# Patient Record
Sex: Male | Born: 1965
Health system: Southern US, Community
[De-identification: ages and names within clinical notes are randomized; demographics above are authoritative.]

## PROBLEM LIST (undated history)

## (undated) DIAGNOSIS — E785 Hyperlipidemia, unspecified: Secondary | ICD-10-CM

## (undated) DIAGNOSIS — Z8619 Personal history of other infectious and parasitic diseases: Secondary | ICD-10-CM

## (undated) HISTORY — DX: Hyperlipidemia, unspecified: E78.5

## (undated) HISTORY — DX: Personal history of other infectious and parasitic diseases: Z86.19

---

## 2003-11-03 ENCOUNTER — Ambulatory Visit: Payer: Self-pay | Admitting: Gastroenterology

## 2003-11-03 LAB — HM COLONOSCOPY

## 2006-03-09 ENCOUNTER — Emergency Department: Payer: Self-pay | Admitting: Emergency Medicine

## 2009-05-02 ENCOUNTER — Ambulatory Visit: Payer: Self-pay | Admitting: Family Medicine

## 2010-07-16 ENCOUNTER — Ambulatory Visit: Payer: Self-pay | Admitting: Family Medicine

## 2011-12-27 ENCOUNTER — Ambulatory Visit: Payer: Self-pay | Admitting: Family Medicine

## 2012-05-24 ENCOUNTER — Ambulatory Visit: Payer: Self-pay | Admitting: Family Medicine

## 2012-10-25 LAB — PSA: PSA: 0.3

## 2012-12-10 ENCOUNTER — Ambulatory Visit: Payer: Self-pay | Admitting: Physician Assistant

## 2013-05-25 ENCOUNTER — Ambulatory Visit: Payer: Self-pay | Admitting: Family Medicine

## 2013-05-26 ENCOUNTER — Ambulatory Visit: Payer: Self-pay

## 2013-10-14 ENCOUNTER — Ambulatory Visit: Payer: Self-pay | Admitting: Unknown Physician Specialty

## 2013-10-14 LAB — CBC AND DIFFERENTIAL
HEMATOCRIT: 48 % (ref 41–53)
Hemoglobin: 17 g/dL (ref 13.5–17.5)
PLATELETS: 279 10*3/uL (ref 150–399)
WBC: 6.3 10^3/mL

## 2013-10-20 DIAGNOSIS — R0989 Other specified symptoms and signs involving the circulatory and respiratory systems: Secondary | ICD-10-CM

## 2013-10-20 DIAGNOSIS — R0609 Other forms of dyspnea: Secondary | ICD-10-CM | POA: Insufficient documentation

## 2014-09-19 DIAGNOSIS — M549 Dorsalgia, unspecified: Secondary | ICD-10-CM | POA: Insufficient documentation

## 2014-09-19 DIAGNOSIS — M545 Low back pain, unspecified: Secondary | ICD-10-CM | POA: Insufficient documentation

## 2014-09-19 DIAGNOSIS — W57XXXA Bitten or stung by nonvenomous insect and other nonvenomous arthropods, initial encounter: Secondary | ICD-10-CM | POA: Insufficient documentation

## 2014-09-19 DIAGNOSIS — E785 Hyperlipidemia, unspecified: Secondary | ICD-10-CM | POA: Insufficient documentation

## 2014-09-19 DIAGNOSIS — J189 Pneumonia, unspecified organism: Secondary | ICD-10-CM | POA: Insufficient documentation

## 2014-09-19 DIAGNOSIS — K602 Anal fissure, unspecified: Secondary | ICD-10-CM | POA: Insufficient documentation

## 2014-09-20 ENCOUNTER — Encounter: Payer: Self-pay | Admitting: Family Medicine

## 2014-09-20 ENCOUNTER — Ambulatory Visit (INDEPENDENT_AMBULATORY_CARE_PROVIDER_SITE_OTHER): Payer: BLUE CROSS/BLUE SHIELD | Admitting: Family Medicine

## 2014-09-20 ENCOUNTER — Ambulatory Visit: Payer: Self-pay | Admitting: Family Medicine

## 2014-09-20 VITALS — BP 124/80 | HR 68 | Temp 98.5°F | Resp 16 | Ht 72.0 in | Wt 209.8 lb

## 2014-09-20 DIAGNOSIS — Z125 Encounter for screening for malignant neoplasm of prostate: Secondary | ICD-10-CM

## 2014-09-20 DIAGNOSIS — R110 Nausea: Secondary | ICD-10-CM | POA: Diagnosis not present

## 2014-09-20 DIAGNOSIS — K219 Gastro-esophageal reflux disease without esophagitis: Secondary | ICD-10-CM | POA: Diagnosis not present

## 2014-09-20 MED ORDER — SUCRALFATE 1 G PO TABS: 1.0000 g | ORAL_TABLET | Freq: Three times a day (TID) | ORAL | Status: DC

## 2014-09-20 MED ORDER — PANTOPRAZOLE SODIUM 20 MG PO TBEC: 20.0000 mg | DELAYED_RELEASE_TABLET | Freq: Every day | ORAL | Status: DC

## 2014-09-20 NOTE — Progress Notes (Signed)
Subjective:     Patient ID: Kyle Bishop, male   DOB: 1965/02/28, 49 y.o.   MRN: [id number]  HPI  Chief Complaint  Patient presents with  . Nausea    Patient comes in office today with concerns of nausea over the past 5 months. Patient states that he experiences this daily and over the past two months has got worse. Patient states " it feels like I am going to vomit in my sleep". Patient denies taking any otc medication and denies history of reflux.   States he also must spit out a syrupy phlegm in the AM but as the day progresses will spit out a foamy phlegm. Feels a "sticking" sensation in his throat which unaffected by swallowing. Denies allergies or heartburn. Has tried a course of omeprazole twice daily for two weeks with little improvement. No alcohol, significant caffeine use or smoking reported. Wishes to get his PSA checked today.   Review of Systems  Constitutional: Negative for fever, chills, appetite change and unexpected weight change.  Gastrointestinal: Negative for vomiting.       Objective:   Physical Exam  Constitutional: He appears well-developed and well-nourished. No distress.  Eyes: No scleral icterus.  Abdominal: Soft. Bowel sounds are normal. There is no tenderness. There is no guarding.  Ears: T.M's intact without inflammation Throat: no tonsillar enlargement or exudate Neck: no cervical adenopathy Lungs: clear     Assessment:    1. Nausea - Comprehensive metabolic panel - Lipase - CBC with Differential/Platelet  2. Screening for prostate cancer - PSA  3. Gastroesophageal reflux disease, esophagitis presence not specified - sucralfate (CARAFATE) 1 G tablet; Take 1 tablet (1 g total) by mouth 4 (four) times daily -  with meals and at bedtime.  Dispense: 28 tablet; Refill: 0 - pantoprazole (PROTONIX) 20 MG tablet; Take 1 tablet (20 mg total) by mouth daily.  Dispense: 30 tablet; Refill: 0    Plan:   Further f/u pending lab work and response to  medication.

## 2014-09-20 NOTE — Patient Instructions (Signed)
We will call you with lab results. Consider head of bed elevation on 6 inch blocks.

## 2014-09-21 ENCOUNTER — Telehealth: Payer: Self-pay

## 2014-09-21 LAB — CBC WITH DIFFERENTIAL/PLATELET
BASOS ABS: 0 10*3/uL (ref 0.0–0.2)
Basos: 0 %
EOS (ABSOLUTE): 0.2 10*3/uL (ref 0.0–0.4)
Eos: 4 %
HEMOGLOBIN: 15.1 g/dL (ref 12.6–17.7)
Hematocrit: 44 % (ref 37.5–51.0)
Immature Grans (Abs): 0 10*3/uL (ref 0.0–0.1)
Immature Granulocytes: 0 %
LYMPHS ABS: 1.6 10*3/uL (ref 0.7–3.1)
Lymphs: 33 %
MCH: 32.1 pg (ref 26.6–33.0)
MCHC: 34.3 g/dL (ref 31.5–35.7)
MCV: 94 fL (ref 79–97)
MONOCYTES: 9 %
Monocytes Absolute: 0.4 10*3/uL (ref 0.1–0.9)
Neutrophils Absolute: 2.5 10*3/uL (ref 1.4–7.0)
Neutrophils: 54 %
PLATELETS: 244 10*3/uL (ref 150–379)
RBC: 4.7 x10E6/uL (ref 4.14–5.80)
RDW: 12.9 % (ref 12.3–15.4)
WBC: 4.7 10*3/uL (ref 3.4–10.8)

## 2014-09-21 LAB — COMPREHENSIVE METABOLIC PANEL
ALK PHOS: 72 IU/L (ref 39–117)
ALT: 34 IU/L (ref 0–44)
AST: 24 IU/L (ref 0–40)
Albumin/Globulin Ratio: 2 (ref 1.1–2.5)
Albumin: 4.2 g/dL (ref 3.5–5.5)
BUN/Creatinine Ratio: 14 (ref 9–20)
BUN: 13 mg/dL (ref 6–24)
Bilirubin Total: 0.9 mg/dL (ref 0.0–1.2)
CHLORIDE: 101 mmol/L (ref 97–108)
CO2: 25 mmol/L (ref 18–29)
CREATININE: 0.92 mg/dL (ref 0.76–1.27)
Calcium: 9.4 mg/dL (ref 8.7–10.2)
GFR calc Af Amer: 112 mL/min/{1.73_m2} (ref >59)
GFR calc non Af Amer: 97 mL/min/{1.73_m2} (ref >59)
GLOBULIN, TOTAL: 2.1 g/dL (ref 1.5–4.5)
GLUCOSE: 91 mg/dL (ref 65–99)
Potassium: 4.2 mmol/L (ref 3.5–5.2)
SODIUM: 139 mmol/L (ref 134–144)
Total Protein: 6.3 g/dL (ref 6.0–8.5)

## 2014-09-21 LAB — LIPASE: LIPASE: 56 U/L (ref 0–59)

## 2014-09-21 LAB — PSA: PROSTATE SPECIFIC AG, SERUM: 0.4 ng/mL (ref 0.0–4.0)

## 2014-09-21 NOTE — Telephone Encounter (Signed)
[id number] 

## 2014-09-21 NOTE — Telephone Encounter (Signed)
-----   Message from Carmon Ginsberg, [place] sent at 09/21/2014  7:43 AM EDT ----- Your labs look great. Let 's see you in two weeks and see how you are doing on the new medication.

## 2014-09-26 NOTE — Telephone Encounter (Signed)
Patient has been advised of PSA he will arrange appt next week with Mikki Santee.KW

## 2014-10-05 ENCOUNTER — Encounter: Payer: Self-pay | Admitting: Family Medicine

## 2014-10-05 ENCOUNTER — Ambulatory Visit (INDEPENDENT_AMBULATORY_CARE_PROVIDER_SITE_OTHER): Payer: BLUE CROSS/BLUE SHIELD | Admitting: Family Medicine

## 2014-10-05 VITALS — BP 110/78 | HR 84 | Temp 98.1°F | Resp 16 | Wt 211.8 lb

## 2014-10-05 DIAGNOSIS — K219 Gastro-esophageal reflux disease without esophagitis: Secondary | ICD-10-CM | POA: Diagnosis not present

## 2014-10-05 MED ORDER — PANTOPRAZOLE SODIUM 20 MG PO TBEC: DELAYED_RELEASE_TABLET | ORAL | Status: DC

## 2014-10-05 NOTE — Progress Notes (Signed)
Subjective:     Patient ID: Kyle Bishop, male   DOB: Sep 10, 1965, 49 y.o.   MRN: [id number]  HPI  Chief Complaint  Patient presents with  . Follow-up    Patient is here for follow u from 8/31/, patient was dianosed weith GERD and prescribed Sucralfate and Protonix. Patient reports that symptoms are improving  Reports reflux has nearly resolved and nausea is gone. Reports he is not spitting as much in the AM. Did not use the Carafate.   Review of Systems     Objective:   Physical Exam  Constitutional: He appears well-developed and well-nourished. No distress.       Assessment:    1. Gastroesophageal reflux disease, esophagitis presence not specified: improved  - pantoprazole (PROTONIX) 20 MG tablet; Daily as needed for acid reflux.  Dispense: 30 tablet; Refill: 2    Plan:    Discussed completing 4 week course of pantoprazole then using prn. May try Carafate at night.

## 2014-10-05 NOTE — Patient Instructions (Signed)
Complete course of the acid blocker then stop if better. Try Carafate at bedtime. May use acid blocker as needed for flareups.

## 2015-03-07 ENCOUNTER — Ambulatory Visit (INDEPENDENT_AMBULATORY_CARE_PROVIDER_SITE_OTHER): Payer: BLUE CROSS/BLUE SHIELD | Admitting: Family Medicine

## 2015-03-07 ENCOUNTER — Telehealth: Payer: Self-pay

## 2015-03-07 ENCOUNTER — Encounter: Payer: Self-pay | Admitting: Family Medicine

## 2015-03-07 VITALS — BP 120/82 | HR 82 | Temp 98.0°F | Resp 16 | Ht 72.0 in | Wt 219.0 lb

## 2015-03-07 DIAGNOSIS — R51 Headache: Secondary | ICD-10-CM

## 2015-03-07 DIAGNOSIS — H539 Unspecified visual disturbance: Secondary | ICD-10-CM

## 2015-03-07 DIAGNOSIS — R519 Headache, unspecified: Secondary | ICD-10-CM

## 2015-03-07 MED ORDER — BUTALBITAL-APAP-CAFFEINE 50-325-40 MG PO TABS: 1.0000 | ORAL_TABLET | Freq: Four times a day (QID) | ORAL | Status: AC | PRN

## 2015-03-07 NOTE — Progress Notes (Signed)
Patient: Kyle Bishop Male    DOB: 02/12/1965   50 y.o.   MRN: [id number] Visit Date: 03/07/2015  Today's Provider: Lelon Huh, MD   Chief Complaint  Patient presents with  . Headache   Subjective:    Headache  This is a new problem. The current episode started yesterday. The problem has been unchanged. Pain location: in the back of his head and on the front left side. The pain quality is similar to prior headaches (had a siomilar headache 2 weeks ago and it last 1 day). The quality of the pain is described as aching and dull. Associated symptoms include insomnia, neck pain and a visual change. Pertinent negatives include no abdominal pain, anorexia, back pain, coughing, dizziness, drainage, ear pain, eye pain, eye redness, eye watering, facial sweating, fever, hearing loss, loss of balance, muscle aches, nausea, numbness, phonophobia, rhinorrhea, scalp tenderness, seizures, sinus pressure, sore throat, swollen glands, tingling, tinnitus, vomiting, weakness or weight loss. Blurred vision: blurred vision yesterday lasted 1 hour. He has tried acetaminophen for the symptoms. The treatment provided no relief.   He states headache started yesterday about 6pm. For about an hour started prior to headache he noticed speckles throughout his vision, seeming to affect both eyes, and all visual fields. Developed headache this morning and noticed that peripheral vision was blurry. Has had no change in diet, no new medications or supplements. No cough or cole symptoms. The headaches start in the back of his head, can be on either side. Today headache has migrated behind his left eye. Marland Kitchen     No Known Allergies Previous Medications   PANTOPRAZOLE (PROTONIX) 20 MG TABLET    Daily as needed for acid reflux.   SUCRALFATE (CARAFATE) 1 G TABLET    Take 1 tablet (1 g total) by mouth 4 (four) times daily -  with meals and at bedtime.    Review of Systems  Constitutional: Negative for fever, chills,  weight loss and appetite change.  HENT: Negative for ear pain, hearing loss, rhinorrhea, sinus pressure, sore throat and tinnitus.   Eyes: Negative for pain and redness. Blurred vision: blurred vision yesterday lasted 1 hour.  Respiratory: Negative for cough, chest tightness, shortness of breath and wheezing.   Cardiovascular: Negative for chest pain and palpitations.  Gastrointestinal: Negative for nausea, vomiting, abdominal pain and anorexia.  Musculoskeletal: Positive for neck pain. Negative for back pain.  Neurological: Positive for headaches. Negative for dizziness, tingling, seizures, weakness, numbness and loss of balance.  Psychiatric/Behavioral: The patient has insomnia.     Social History  Substance Use Topics  . Smoking status: Never Smoker   . Smokeless tobacco: Not on file  . Alcohol Use: No   Objective:   BP 120/82 mmHg  Pulse 82  Temp(Src) 98 F (36.7 C) (Oral)  Resp 16  Ht 6' (1.829 m)  Wt 219 lb (99.338 kg)  BMI 29.70 kg/m2  SpO2 97%  Physical Exam  General Appearance:    Alert, cooperative, no distress  HENT:   ENT exam normal, no neck nodes or sinus tenderness  Eyes:    PERRL, conjunctiva/corneas clear, EOM's intact       Lungs:     Clear to auscultation bilaterally, respirations unlabored  Heart:    Regular rate and rhythm  Neurologic:   Awake, alert, oriented x 3. No apparent focal neurological           defect.  Assessment & Plan:     1. Visual changes  - Comprehensive metabolic panel - CBC - Ambulatory referral to Ophthalmology  2. Acute nonintractable headache, unspecified headache type Long differential . Rule out ocular disease as above. Possibly new onset migraines. Consider imaging studies if ophthalmology evaluation is unremarkable.  - Comprehensive metabolic panel - CBC - butalbital-acetaminophen-caffeine (FIORICET) 50-325-40 MG tablet; Take 1-2 tablets by mouth every 6 (six) hours as needed for headache.  Dispense: 20  tablet; Refill: 0       Lelon Huh, MD  [facility]

## 2015-03-07 NOTE — Telephone Encounter (Signed)
Patient called stating yesterday he developed burred vision that lasted 1 hr. Right after the blurred vision, he started having a dull headache in the back of his head. Patient states the headache has been constant is not easing up. Patient denies any numbness, shortness of breath, chest pain, lightheadedness or dizziness. Patient states he has been off his cholesterol medication for 6 months. He restarted taking the cholesterol medication 1 week ago. I did not see any medication in his chart that is prescribed for cholesterol and patient does not remember the name of it. Patient has scheduled an appointment to come in today at 2:45pm.

## 2015-03-08 LAB — CBC
HEMATOCRIT: 40.8 % (ref 37.5–51.0)
Hemoglobin: 14.8 g/dL (ref 12.6–17.7)
MCH: 33.7 pg — AB (ref 26.6–33.0)
MCHC: 36.3 g/dL — AB (ref 31.5–35.7)
MCV: 93 fL (ref 79–97)
PLATELETS: 255 10*3/uL (ref 150–379)
RBC: 4.39 x10E6/uL (ref 4.14–5.80)
RDW: 12.1 % — AB (ref 12.3–15.4)
WBC: 5.4 10*3/uL (ref 3.4–10.8)

## 2015-03-08 LAB — COMPREHENSIVE METABOLIC PANEL
ALK PHOS: 76 IU/L (ref 39–117)
ALT: 27 IU/L (ref 0–44)
AST: 21 IU/L (ref 0–40)
Albumin/Globulin Ratio: 2 (ref 1.1–2.5)
Albumin: 4.3 g/dL (ref 3.5–5.5)
BILIRUBIN TOTAL: 0.3 mg/dL (ref 0.0–1.2)
BUN / CREAT RATIO: 16 (ref 9–20)
BUN: 15 mg/dL (ref 6–24)
CO2: 20 mmol/L (ref 18–29)
CREATININE: 0.94 mg/dL (ref 0.76–1.27)
Calcium: 9.4 mg/dL (ref 8.7–10.2)
Chloride: 103 mmol/L (ref 96–106)
GFR calc non Af Amer: 94 mL/min/{1.73_m2} (ref >59)
GFR, EST AFRICAN AMERICAN: 109 mL/min/{1.73_m2} (ref >59)
GLOBULIN, TOTAL: 2.1 g/dL (ref 1.5–4.5)
GLUCOSE: 102 mg/dL — AB (ref 65–99)
Potassium: 3.9 mmol/L (ref 3.5–5.2)
SODIUM: 141 mmol/L (ref 134–144)
Total Protein: 6.4 g/dL (ref 6.0–8.5)

## 2015-03-22 ENCOUNTER — Telehealth: Payer: Self-pay

## 2015-03-22 DIAGNOSIS — H539 Unspecified visual disturbance: Secondary | ICD-10-CM

## 2015-03-22 DIAGNOSIS — R51 Headache: Principal | ICD-10-CM

## 2015-03-22 DIAGNOSIS — R519 Headache, unspecified: Secondary | ICD-10-CM

## 2015-03-22 NOTE — Telephone Encounter (Signed)
Patient came up to the office wanting to know what the next step was for treatment of his headaches and blurred vision. Patient was referred to Dr. Jeni Salles and seen on 03/09/2015. Per patient reports Dr. Jeni Salles says there is nothing he could do for him. Patient states Dr. Jeni Salles called and spoke with Dr. Caryn Section about that visit. Patient is still having the same symptoms and wants to know what is the next step of treatment. I didn't see a consult note from Dr. Vickii Penna office. I called over to [facility] and requested records. Patient call back: [phone]

## 2015-03-23 DIAGNOSIS — H539 Unspecified visual disturbance: Secondary | ICD-10-CM | POA: Insufficient documentation

## 2015-03-23 DIAGNOSIS — R51 Headache: Secondary | ICD-10-CM

## 2015-03-23 DIAGNOSIS — R519 Headache, unspecified: Secondary | ICD-10-CM | POA: Insufficient documentation

## 2015-03-23 NOTE — Telephone Encounter (Signed)
Need to proceed with MRI of brain. Need to schedule o.v. 1-2 days after MRI. Please forward to sarah to schedule MRI of brain with contrast.

## 2015-03-23 NOTE — Telephone Encounter (Signed)
[id number] 

## 2015-03-26 NOTE — Telephone Encounter (Signed)
Patient advised and verbally voiced understanding. MRI scheduled for 04/09/2015. Patient states he will call back to schedule follow up.

## 2015-04-09 ENCOUNTER — Ambulatory Visit
Admission: RE | Admit: 2015-04-09 | Discharge: 2015-04-09 | Disposition: A | Discharge status: Home / Self Care | Payer: BLUE CROSS/BLUE SHIELD | Source: Ambulatory Visit | Attending: Family Medicine | Admitting: Family Medicine

## 2015-04-09 DIAGNOSIS — J328 Other chronic sinusitis: Secondary | ICD-10-CM | POA: Insufficient documentation

## 2015-04-09 DIAGNOSIS — H539 Unspecified visual disturbance: Secondary | ICD-10-CM | POA: Diagnosis present

## 2015-04-09 DIAGNOSIS — R519 Headache, unspecified: Secondary | ICD-10-CM

## 2015-04-09 DIAGNOSIS — R51 Headache: Secondary | ICD-10-CM | POA: Diagnosis present

## 2015-04-09 MED ORDER — GADOBENATE DIMEGLUMINE 529 MG/ML IV SOLN
20.0000 mL | Freq: Once | INTRAVENOUS | Status: AC | PRN
  Administered 2015-04-09: 20 mL via INTRAVENOUS

## 2015-06-06 ENCOUNTER — Encounter: Payer: Self-pay | Admitting: Family Medicine

## 2015-06-06 ENCOUNTER — Ambulatory Visit (INDEPENDENT_AMBULATORY_CARE_PROVIDER_SITE_OTHER): Payer: BLUE CROSS/BLUE SHIELD | Admitting: Family Medicine

## 2015-06-06 VITALS — BP 110/80 | HR 82 | Temp 97.6°F | Resp 16 | Ht 72.0 in | Wt 210.0 lb

## 2015-06-06 DIAGNOSIS — J069 Acute upper respiratory infection, unspecified: Secondary | ICD-10-CM | POA: Diagnosis not present

## 2015-06-06 DIAGNOSIS — E785 Hyperlipidemia, unspecified: Secondary | ICD-10-CM

## 2015-06-06 MED ORDER — AMOXICILLIN 500 MG PO CAPS: 1000.0000 mg | ORAL_CAPSULE | Freq: Two times a day (BID) | ORAL | Status: AC

## 2015-06-06 MED ORDER — LOVASTATIN 20 MG PO TABS: 20.0000 mg | ORAL_TABLET | Freq: Every day | ORAL | Status: DC

## 2015-06-06 NOTE — Progress Notes (Signed)
Patient: Kyle Bishop Male    DOB: 12/23/1965   50 y.o.   MRN: [id number] Visit Date: 06/06/2015  Today's Provider: Lelon Huh, MD   Chief Complaint  Patient presents with  . Cough   Subjective:    Cough This is a new problem. The current episode started in the past 7 days (4 days ago). The problem has been gradually improving. The problem occurs constantly. The cough is productive of sputum. Associated symptoms include ear congestion, headaches, myalgias, nasal congestion, postnasal drip and shortness of breath. Pertinent negatives include no chest pain, chills, ear pain, fever, heartburn, hemoptysis, rash, rhinorrhea, sore throat, sweats, weight loss or wheezing. The symptoms are aggravated by cold air. Treatments tried: sudafed. The treatment provided moderate relief. His past medical history is significant for pneumonia. There is no history of asthma, bronchiectasis, bronchitis, COPD, emphysema or environmental allergies.    Cough started 4 days ago. Symptoms of sinus congestion, ear congestion, productive cough, and headaches. Also has some sob.Has been taking sudafed and feels much better today.   He also states he has been off of lovastatin for several months, but once to get new prescription sent to his mail order so he can restart medication.    No Known Allergies Previous Medications   BUTALBITAL-ACETAMINOPHEN-CAFFEINE (FIORICET) 50-325-40 MG TABLET    Take 1-2 tablets by mouth every 6 (six) hours as needed for headache.   PANTOPRAZOLE (PROTONIX) 20 MG TABLET    Daily as needed for acid reflux.    Review of Systems  Constitutional: Negative for fever, chills, weight loss and appetite change.  HENT: Positive for congestion and postnasal drip. Negative for ear pain, rhinorrhea and sore throat.   Respiratory: Positive for cough and shortness of breath. Negative for hemoptysis, chest tightness and wheezing.   Cardiovascular: Negative for chest pain and  palpitations.  Gastrointestinal: Negative for heartburn, nausea, vomiting and abdominal pain.  Musculoskeletal: Positive for myalgias.  Skin: Negative for rash.  Allergic/Immunologic: Negative for environmental allergies.  Neurological: Positive for headaches.  cough   Social History  Substance Use Topics  . Smoking status: Never Smoker   . Smokeless tobacco: Not on file  . Alcohol Use: No   Objective:   BP 110/80 mmHg  Pulse 82  Temp(Src) 97.6 F (36.4 C) (Oral)  Resp 16  Ht 6' (1.829 m)  Wt 210 lb (95.255 kg)  BMI 28.47 kg/m2  SpO2 97%  Physical Exam  General Appearance:    Alert, cooperative, no distress  HENT:   bilateral TM normal without fluid or infection, neck without nodes, sinuses nontender, post nasal drip noted and nasal mucosa pale and congested  Eyes:    PERRL, conjunctiva/corneas clear, EOM's intact       Lungs:     Clear to auscultation bilaterally, respirations unlabored  Heart:    Regular rate and rhythm  Neurologic:   Awake, alert, oriented x 3. No apparent focal neurological           defect.           Assessment & Plan:     1. Upper respiratory infection Starting to improve with symptomatic treatment. Sent with prescription for amoxicillin to fill only if symptoms worsen or do not continue to improve.  - amoxicillin (AMOXIL) 500 MG capsule; Take 2 capsules (1,000 mg total) by mouth 2 (two) times daily.  Dispense: 28 capsule; Refill: 0  2. Hyperlipidemia Restart lovastatin and follow up for labs and  CPE in a couple of months.  - lovastatin (MEVACOR) 20 MG tablet; Take 1 tablet (20 mg total) by mouth at bedtime.  Dispense: 90 tablet; Refill: 2        Lelon Huh, MD  [facility]

## 2015-06-11 ENCOUNTER — Telehealth: Payer: Self-pay | Admitting: Family Medicine

## 2015-06-11 MED ORDER — AZITHROMYCIN 250 MG PO TABS: ORAL_TABLET | ORAL | Status: AC

## 2015-06-11 NOTE — Telephone Encounter (Signed)
Pt states he was in last week for cough and congestion.  Pt states he is not any better.  Pt is requesting a zpack. [facility].  [id number]

## 2015-08-27 ENCOUNTER — Ambulatory Visit (INDEPENDENT_AMBULATORY_CARE_PROVIDER_SITE_OTHER): Payer: BLUE CROSS/BLUE SHIELD | Admitting: Family Medicine

## 2015-08-27 ENCOUNTER — Encounter: Payer: Self-pay | Admitting: Family Medicine

## 2015-08-27 VITALS — BP 116/78 | HR 88 | Temp 97.7°F | Resp 16 | Wt 207.0 lb

## 2015-08-27 DIAGNOSIS — J329 Chronic sinusitis, unspecified: Secondary | ICD-10-CM

## 2015-08-27 MED ORDER — AZITHROMYCIN 250 MG PO TABS: ORAL_TABLET | ORAL | 0 refills | Status: AC

## 2015-08-27 NOTE — Progress Notes (Signed)
        Patient: Kyle Bishop Male    DOB: Sep 18, 1965   50 y.o.   MRN: [id number] Visit Date: 08/27/2015  Today's Provider: Lelon Huh, MD   Chief Complaint  Patient presents with  . Hoarse  . Sinusitis   Subjective:    Sinusitis  This is a chronic problem. The current episode started more than 1 month ago. The problem is unchanged. There has been no fever. Associated symptoms include congestion, ear pain (Pt reports having ear prassure all the time. ), shortness of breath, sinus pressure and a sore throat. Pertinent negatives include no coughing, headaches or sneezing.   He had similar symptoms in May and states symptoms did not resolve until he got on a Zpack.      No Known Allergies Current Meds  Medication Sig  . lovastatin (MEVACOR) 20 MG tablet Take 1 tablet (20 mg total) by mouth at bedtime.    Review of Systems  Constitutional: Negative.   HENT: Positive for congestion, ear pain (Pt reports having ear prassure all the time. ), nosebleeds, sinus pressure, sore throat and voice change. Negative for ear discharge, postnasal drip, rhinorrhea, sneezing, tinnitus and trouble swallowing.   Eyes: Negative.   Respiratory: Positive for apnea and shortness of breath. Negative for cough, choking, chest tightness, wheezing and stridor.   Cardiovascular: Negative.   Gastrointestinal: Negative.   Allergic/Immunologic: Negative for environmental allergies and food allergies.  Neurological: Negative for dizziness, light-headedness and headaches.    Social History  Substance Use Topics  . Smoking status: Never Smoker  . Smokeless tobacco: Not on file  . Alcohol use No   Objective:   BP 116/78 (BP Location: Left Arm, Patient Position: Sitting, Cuff Size: Normal)   Pulse 88   Temp 97.7 F (36.5 C) (Oral)   Resp 16   Wt 207 lb (93.9 kg)   BMI 28.07 kg/m   Physical Exam  General Appearance:    Alert, cooperative, no distress  HENT:   bilateral TM normal without  fluid or infection, neck without nodes, throat normal without erythema or exudate, post nasal drip noted and nasal mucosa pale and congested  Eyes:    PERRL, conjunctiva/corneas clear, EOM's intact       Lungs:     Clear to auscultation bilaterally, respirations unlabored  Heart:    Regular rate and rhythm  Neurologic:   Awake, alert, oriented x 3. No apparent focal neurological           defect.           Assessment & Plan:     1. Sinusitis, unspecified chronicity, unspecified location Likely has some underlying allergies. Consider singulair and antihistamine.  - azithromycin (ZITHROMAX) 250 MG tablet; 2 by mouth today, then 1 daily for 4 days  Dispense: 6 tablet; Refill: 0     .The entirety of the information documented in the History of Present Illness, Review of Systems and Physical Exam were personally obtained by me. Portions of this information were initially documented by Ashley Royalty, CMA and reviewed by me for thoroughness and accuracy.    Lelon Huh, MD  [facility]

## 2015-09-07 ENCOUNTER — Encounter: Payer: BLUE CROSS/BLUE SHIELD | Admitting: Family Medicine

## 2016-01-07 ENCOUNTER — Ambulatory Visit (INDEPENDENT_AMBULATORY_CARE_PROVIDER_SITE_OTHER): Payer: BLUE CROSS/BLUE SHIELD | Admitting: Family Medicine

## 2016-01-07 ENCOUNTER — Encounter: Payer: Self-pay | Admitting: Family Medicine

## 2016-01-07 VITALS — BP 118/80 | HR 108 | Temp 100.2°F | Resp 16 | Wt 214.0 lb

## 2016-01-07 DIAGNOSIS — J329 Chronic sinusitis, unspecified: Secondary | ICD-10-CM | POA: Diagnosis not present

## 2016-01-07 LAB — POCT INFLUENZA A/B
INFLUENZA A, POC: NEGATIVE
INFLUENZA B, POC: NEGATIVE

## 2016-01-07 MED ORDER — AMOXICILLIN 500 MG PO CAPS: 1000.0000 mg | ORAL_CAPSULE | Freq: Two times a day (BID) | ORAL | 0 refills | Status: AC

## 2016-01-07 NOTE — Progress Notes (Signed)
       Patient: Kyle Bishop Male    DOB: 1966-01-17   50 y.o.   MRN: [id number] Visit Date: 01/07/2016  Today's Provider: Lelon Huh, MD   Chief Complaint  Patient presents with  . URI   Subjective:    HPI Patient comes in today c/o body aches, fever, headache, cough and congestion. He reports that he has had symptoms X 2 days. Patient reports that he has only been taking Mucinex for his symptoms. Patient reports that he has not had a flu vaccine this year.     No Known Allergies   Current Outpatient Prescriptions:  .  butalbital-acetaminophen-caffeine (FIORICET) 50-325-40 MG tablet, Take 1-2 tablets by mouth every 6 (six) hours as needed for headache. (Patient not taking: Reported on 01/07/2016), Disp: 20 tablet, Rfl: 0 .  lovastatin (MEVACOR) 20 MG tablet, Take 1 tablet (20 mg total) by mouth at bedtime. (Patient not taking: Reported on 01/07/2016), Disp: 90 tablet, Rfl: 2 .  pantoprazole (PROTONIX) 20 MG tablet, Daily as needed for acid reflux. (Patient not taking: Reported on 01/07/2016), Disp: 30 tablet, Rfl: 2  Review of Systems  Constitutional: Positive for activity change, appetite change, chills, fatigue and fever.  HENT: Positive for congestion, postnasal drip, rhinorrhea and sinus pressure.   Respiratory: Positive for cough.   Cardiovascular: Negative.   Musculoskeletal: Positive for neck pain.  Neurological: Positive for headaches.    Social History  Substance Use Topics  . Smoking status: Never Smoker  . Smokeless tobacco: Not on file  . Alcohol use No   Objective:   BP 118/80 (BP Location: Right Arm, Patient Position: Sitting, Cuff Size: Normal)   Pulse (!) 108   Temp 100.2 F (37.9 C)   Resp 16   Wt 214 lb (97.1 kg)   SpO2 98%   BMI 29.02 kg/m   Physical Exam  General Appearance:    Alert, cooperative, no distress  HENT:   bilateral TM normal without fluid or infection, neck without nodes, pharynx erythematous without exudate, frontal  sinuses tender and nasal mucosa pale and congested  Eyes:    PERRL, conjunctiva/corneas clear, EOM's intact       Lungs:     Clear to auscultation bilaterally, respirations unlabored  Heart:    Regular rate and rhythm  Neurologic:   Awake, alert, oriented x 3. No apparent focal neurological           defect.       Flu A -Negative Flu B-Negative    Assessment & Plan:     1. Sinusitis, unspecified chronicity, unspecified location  - amoxicillin (AMOXIL) 500 MG capsule; Take 2 capsules (1,000 mg total) by mouth 2 (two) times daily.  Dispense: 40 capsule; Refill: 0     The entirety of the information documented in the History of Present Illness, Review of Systems and Physical Exam were personally obtained by me. Portions of this information were initially documented by Wilburt Finlay, CMA and reviewed by me for thoroughness and accuracy.    Lelon Huh, MD  [facility]

## 2016-01-07 NOTE — Patient Instructions (Signed)

## 2016-01-07 NOTE — Addendum Note (Signed)
Addended by: Wilburt Finlay L on: 01/07/2016 10:15 AM   Modules accepted: Orders

## 2016-01-10 ENCOUNTER — Ambulatory Visit (INDEPENDENT_AMBULATORY_CARE_PROVIDER_SITE_OTHER): Payer: BLUE CROSS/BLUE SHIELD | Admitting: Physician Assistant

## 2016-01-10 ENCOUNTER — Ambulatory Visit
Admission: RE | Admit: 2016-01-10 | Discharge: 2016-01-10 | Disposition: A | Discharge status: Home / Self Care | Payer: BLUE CROSS/BLUE SHIELD | Source: Ambulatory Visit | Attending: Physician Assistant | Admitting: Physician Assistant

## 2016-01-10 ENCOUNTER — Encounter: Payer: Self-pay | Admitting: Physician Assistant

## 2016-01-10 ENCOUNTER — Telehealth: Payer: Self-pay

## 2016-01-10 VITALS — BP 116/78 | HR 80 | Temp 98.2°F | Resp 16 | Wt 210.0 lb

## 2016-01-10 DIAGNOSIS — R05 Cough: Secondary | ICD-10-CM | POA: Insufficient documentation

## 2016-01-10 DIAGNOSIS — R059 Cough, unspecified: Secondary | ICD-10-CM

## 2016-01-10 DIAGNOSIS — R509 Fever, unspecified: Secondary | ICD-10-CM

## 2016-01-10 DIAGNOSIS — I700 Atherosclerosis of aorta: Secondary | ICD-10-CM | POA: Insufficient documentation

## 2016-01-10 NOTE — Patient Instructions (Signed)
Community-Acquired Pneumonia, Adult °Introduction °Pneumonia is an infection of the lungs. One type of pneumonia can happen while a person is in a hospital. A different type can happen when a person is not in a hospital (community-acquired pneumonia). It is easy for this kind to spread from person to person. It can spread to you if you breathe near an infected person who coughs or sneezes. Some symptoms include: °· A dry cough. °· A wet (productive) cough. °· Fever. °· Sweating. °· Chest pain. °Follow these instructions at home: °· Take over-the-counter and prescription medicines only as told by your doctor. °¨ Only take cough medicine if you are losing sleep. °¨ If you were prescribed an antibiotic medicine, take it as told by your doctor. Do not stop taking the antibiotic even if you start to feel better. °· Sleep with your head and neck raised (elevated). You can do this by putting a few pillows under your head, or you can sleep in a recliner. °· Do not use tobacco products. These include cigarettes, chewing tobacco, and e-cigarettes. If you need help quitting, ask your doctor. °· Drink enough water to keep your pee (urine) clear or pale yellow. °A shot (vaccine) can help prevent pneumonia. Shots are often suggested for: °· People older than 51 years of age. °· People older than 51 years of age: °¨ Who are having cancer treatment. °¨ Who have long-term (chronic) lung disease. °¨ Who have problems with their body's defense system (immune system). °You may also prevent pneumonia if you take these actions: °· Get the flu (influenza) shot every year. °· Go to the dentist as often as told. °· Wash your hands often. If soap and water are not available, use hand sanitizer. °Contact a doctor if: °· You have a fever. °· You lose sleep because your cough medicine does not help. °Get help right away if: °· You are short of breath and it gets worse. °· You have more chest pain. °· Your sickness gets worse. This is very  serious if: °¨ You are an older adult. °¨ Your body's defense system is weak. °· You cough up blood. °This information is not intended to replace advice given to you by your health care provider. Make sure you discuss any questions you have with your health care provider. °Document Released: 06/25/2007 Document Revised: 06/14/2015 Document Reviewed: 05/03/2014 °© 2017 [employer]vier ° °

## 2016-01-10 NOTE — Telephone Encounter (Signed)
Patient advised as below. Patient verbalizes understanding and is in agreement with treatment plan.  

## 2016-01-10 NOTE — Telephone Encounter (Signed)
-----   Message from Trinna Post, [place] sent at 01/10/2016 10:04 AM EST ----- There is no evidence of pneumonia on CXR. Patient should continue to take amoxicillin until course is complete. May be nonspecific viral infection. Waiting on CBC results.

## 2016-01-10 NOTE — Progress Notes (Signed)
Patient: Kyle Bishop Male    DOB: 11-Sep-1965   50 y.o.   MRN: [id number] Visit Date: 01/10/2016  Today's Provider: Trinna Post, PA-C   Chief Complaint  Patient presents with  . Sinusitis  . URI   Subjective:    URI   This is a new problem. The current episode started in the past 7 days. The problem has been unchanged. The maximum temperature recorded prior to his arrival was 100.4 - 100.9 F. The fever has been present for 3 to 4 days (Higher at night). Associated symptoms include congestion, coughing, ear pain (Left ear pain), headaches, rhinorrhea and sinus pain. Pertinent negatives include no sneezing, sore throat or wheezing.   Patient is a 50 y/o male with no smoking history c/o upper respiratory symptoms and fever ongoing for five days. He was seen in clinic on 01/07/2016, diagnosed with sinusitis and given amoxicillin. His in office flu swab was negative at this time. He has been taking 1000 mg BID and says this helped with his headache and ear pain but he is still running fevers at night and sweating so much so that he has to change the bed sheets. He says his fevers run about 101F at night. He has travelled outside of the country to [place] many years ago, has not been knowingly exposed to anyone with Tb, never been in the prison system and never been homeless. Is coughing and still SOB, still has some left ear pain/pressure but no drainage. No unexpected weight loss. No nausea/vomiting.     No Known Allergies   Current Outpatient Prescriptions:  .  amoxicillin (AMOXIL) 500 MG capsule, Take 2 capsules (1,000 mg total) by mouth 2 (two) times daily., Disp: 40 capsule, Rfl: 0 .  butalbital-acetaminophen-caffeine (FIORICET) 50-325-40 MG tablet, Take 1-2 tablets by mouth every 6 (six) hours as needed for headache., Disp: 20 tablet, Rfl: 0 .  lovastatin (MEVACOR) 20 MG tablet, Take 1 tablet (20 mg total) by mouth at bedtime., Disp: 90 tablet, Rfl: 2 .  pantoprazole  (PROTONIX) 20 MG tablet, Daily as needed for acid reflux., Disp: 30 tablet, Rfl: 2  Review of Systems  Constitutional: Positive for chills, diaphoresis, fatigue and fever. Negative for activity change, appetite change and unexpected weight change.  HENT: Positive for congestion, ear pain (Left ear pain), rhinorrhea, sinus pain and sinus pressure. Negative for ear discharge, facial swelling, hearing loss, mouth sores, nosebleeds, postnasal drip, sneezing, sore throat, tinnitus, trouble swallowing and voice change.   Eyes: Negative.   Respiratory: Positive for cough and shortness of breath. Negative for apnea, choking, chest tightness, wheezing and stridor.   Gastrointestinal: Negative.   Neurological: Positive for headaches. Negative for dizziness and light-headedness.    Social History  Substance Use Topics  . Smoking status: Never Smoker  . Smokeless tobacco: Not on file  . Alcohol use No   Objective:   BP 116/78 (BP Location: Left Arm, Patient Position: Sitting, Cuff Size: Large)   Pulse 80   Temp 98.2 F (36.8 C) (Oral)   Resp 16   Wt 210 lb (95.3 kg)   BMI 28.48 kg/m   Physical Exam  Constitutional: He is oriented to person, place, and time. He appears well-developed and well-nourished. No distress.  HENT:  Right Ear: Tympanic membrane and external ear normal.  Left Ear: External ear normal.  Nose: No rhinorrhea. Right sinus exhibits no maxillary sinus tenderness and no frontal sinus tenderness. Left sinus exhibits no  maxillary sinus tenderness and no frontal sinus tenderness.  Mouth/Throat: Uvula is midline, oropharynx is clear and moist and mucous membranes are normal. No oropharyngeal exudate.  Left Tm opaque  Eyes: Conjunctivae are normal. Right eye exhibits no discharge. Left eye exhibits no discharge.  Watery Discharge   Neck: Normal range of motion. Neck supple.  Cardiovascular: Normal rate and regular rhythm.   Pulmonary/Chest: Effort normal and breath sounds  normal. No respiratory distress. He has no wheezes. He has no rales.  Patient not SOB  Lymphadenopathy:    He has no cervical adenopathy.  Neurological: He is alert and oriented to person, place, and time.  Skin: Skin is warm and dry. He is not diaphoretic.  Psychiatric: He has a normal mood and affect. His behavior is normal.        Assessment & Plan:     1. Fever, unspecified fever cause  Patient is 50 y/o male with URI symptoms seen in clinic on 01/07/2016 and re-presenting today with a continuation of the same and also fevers. On amoxicillin 1000 mg BID. Will evaluate as below. Instructed pt to keep taking abx until further notified. Flu negative, low suspicion for Tb. Possible pneumonia or other unspecified viral illness.  - CBC with Differential/Platelet - DG Chest 2 View; Future  2. Cough  Evaluate as below. Continue amoxicillin for now. Offered inhaler, patient declines.  - DG Chest 2 View; Future  Return if symptoms worsen or fail to improve.  Patient Instructions  Community-Acquired Pneumonia, Adult Introduction Pneumonia is an infection of the lungs. One type of pneumonia can happen while a person is in a hospital. A different type can happen when a person is not in a hospital (community-acquired pneumonia). It is easy for this kind to spread from person to person. It can spread to you if you breathe near an infected person who coughs or sneezes. Some symptoms include:  A dry cough.  A wet (productive) cough.  Fever.  Sweating.  Chest pain. Follow these instructions at home:  Take over-the-counter and prescription medicines only as told by your doctor.  Only take cough medicine if you are losing sleep.  If you were prescribed an antibiotic medicine, take it as told by your doctor. Do not stop taking the antibiotic even if you start to feel better.  Sleep with your head and neck raised (elevated). You can do this by putting a few pillows under your head, or  you can sleep in a recliner.  Do not use tobacco products. These include cigarettes, chewing tobacco, and e-cigarettes. If you need help quitting, ask your doctor.  Drink enough water to keep your pee (urine) clear or pale yellow. A shot (vaccine) can help prevent pneumonia. Shots are often suggested for:  People older than 51 years of age.  People older than 51 years of age:  Who are having cancer treatment.  Who have long-term (chronic) lung disease.  Who have problems with their body's defense system (immune system). You may also prevent pneumonia if you take these actions:  Get the flu (influenza) shot every year.  Go to the dentist as often as told.  Wash your hands often. If soap and water are not available, use hand sanitizer. Contact a doctor if:  You have a fever.  You lose sleep because your cough medicine does not help. Get help right away if:  You are short of breath and it gets worse.  You have more chest pain.  Your sickness gets  worse. This is very serious if:  You are an older adult.  Your body's defense system is weak.  You cough up blood. This information is not intended to replace advice given to you by your health care provider. Make sure you discuss any questions you have with your health care provider. Document Released: 06/25/2007 Document Revised: 06/14/2015 Document Reviewed: 05/03/2014  2017 Elsevier    The entirety of the information documented in the History of Present Illness, Review of Systems and Physical Exam were personally obtained by me. Portions of this information were initially documented by Ashley Royalty, CMA and reviewed by me for thoroughness and accuracy.         Trinna Post, PA-C  [facility]

## 2016-01-11 ENCOUNTER — Telehealth: Payer: Self-pay

## 2016-01-11 LAB — SPECIMEN STATUS REPORT

## 2016-01-11 LAB — CBC WITH DIFFERENTIAL/PLATELET
Basophils Absolute: 0 10*3/uL (ref 0.0–0.2)
Basos: 1 %
EOS (ABSOLUTE): 0.2 10*3/uL (ref 0.0–0.4)
Eos: 6 %
Hematocrit: 44.4 % (ref 37.5–51.0)
Hemoglobin: 15.6 g/dL (ref 13.0–17.7)
Immature Grans (Abs): 0 10*3/uL (ref 0.0–0.1)
Immature Granulocytes: 1 %
Lymphocytes Absolute: 1.4 10*3/uL (ref 0.7–3.1)
Lymphs: 39 %
MCH: 33.1 pg — ABNORMAL HIGH (ref 26.6–33.0)
MCHC: 35.1 g/dL (ref 31.5–35.7)
MCV: 94 fL (ref 79–97)
Monocytes Absolute: 0.7 10*3/uL (ref 0.1–0.9)
Monocytes: 19 %
Neutrophils Absolute: 1.2 10*3/uL — ABNORMAL LOW (ref 1.4–7.0)
Neutrophils: 34 %
Platelets: 232 10*3/uL (ref 150–379)
RBC: 4.72 x10E6/uL (ref 4.14–5.80)
RDW: 11.8 % — ABNORMAL LOW (ref 12.3–15.4)
WBC: 3.6 10*3/uL (ref 3.4–10.8)

## 2016-01-11 NOTE — Telephone Encounter (Signed)
Patient advised as below. Patient verbalizes understanding and is in agreement with treatment plan.  

## 2016-01-11 NOTE — Telephone Encounter (Signed)
-----   Message from Trinna Post, [place] sent at 01/11/2016  9:33 AM EST ----- There is evidence of some bacterial infection, but no elevated white count. Please continue taking antibiotics for the entire course.

## 2016-07-27 ENCOUNTER — Other Ambulatory Visit: Payer: Self-pay | Admitting: Family Medicine

## 2016-07-27 DIAGNOSIS — E785 Hyperlipidemia, unspecified: Secondary | ICD-10-CM

## 2016-09-10 ENCOUNTER — Encounter: Payer: Self-pay | Admitting: Family Medicine

## 2016-09-10 ENCOUNTER — Ambulatory Visit (INDEPENDENT_AMBULATORY_CARE_PROVIDER_SITE_OTHER): Payer: BLUE CROSS/BLUE SHIELD | Admitting: Family Medicine

## 2016-09-10 VITALS — BP 118/80 | HR 81 | Temp 98.5°F | Resp 16 | Wt 219.4 lb

## 2016-09-10 DIAGNOSIS — L309 Dermatitis, unspecified: Secondary | ICD-10-CM

## 2016-09-10 DIAGNOSIS — D229 Melanocytic nevi, unspecified: Secondary | ICD-10-CM

## 2016-09-10 MED ORDER — PERMETHRIN 5 % EX CREA: 1.0000 "application " | TOPICAL_CREAM | Freq: Once | CUTANEOUS | 1 refills | Status: AC

## 2016-09-10 NOTE — Progress Notes (Signed)
   Patient: Kyle Bishop Male    DOB: 09-28-1965   51 y.o.   MRN: [id number] Visit Date: 09/10/2016  Today's Provider: Lelon Huh, MD   Chief Complaint  Patient presents with  . Rash   Subjective:    Rash  This is a new problem. Episode onset: 5 weeks ago. Patient's wife had the same rash that started 3 weeks prior.  The problem has been gradually improving since onset. Location: entire back. The rash is characterized by redness, itchiness and burning (Patient states it feels like he is getting bit by something). He was exposed to nothing. Past treatments include nothing.      Previous Medications   LOVASTATIN (MEVACOR) 20 MG TABLET    TAKE 1 TABLET AT BEDTIME   PANTOPRAZOLE (PROTONIX) 20 MG TABLET    Daily as needed for acid reflux.    Review of Systems  Constitutional: Negative.   Gastrointestinal: Negative.   Skin: Positive for rash.    Social History  Substance Use Topics  . Smoking status: Never Smoker  . Smokeless tobacco: Never Used  . Alcohol use No   Objective:   BP 118/80 (BP Location: Right Arm, Patient Position: Sitting, Cuff Size: Normal)   Pulse 81   Temp 98.5 F (36.9 C) (Oral)   Resp 16   Wt 219 lb 6.4 oz (99.5 kg)   SpO2 97%   BMI 29.76 kg/m   Physical Exam  Several small flesh colored lesions scattered on both legs, forearms and hands with surrounding excoriations appear to have small burrows suspicious for scabies.   He also had about 47mm raised, well circumscribed lightly pigmented lesion on right side that he wants to have evaluated by dermatologist.     Assessment & Plan:     1. Dermatitis Suspect Scabies  - permethrin (ELIMITE) 5 % cream; Apply 1 application topically once. From head to toe  Dispense: 120 g; Refill: 1  2. Atypical mole  - Ambulatory referral to Dermatology

## 2016-09-16 ENCOUNTER — Telehealth: Payer: Self-pay | Admitting: Family Medicine

## 2016-09-16 DIAGNOSIS — R21 Rash and other nonspecific skin eruption: Secondary | ICD-10-CM

## 2016-09-16 MED ORDER — TRIAMCINOLONE ACETONIDE 0.025 % EX LOTN: TOPICAL_LOTION | CUTANEOUS | 0 refills | Status: AC

## 2016-09-16 NOTE — Telephone Encounter (Signed)
Patient was notified. Patient already has appt with dermatology on 10/02/2016.

## 2016-09-16 NOTE — Telephone Encounter (Signed)
Can try triamcinolone lotion, [facility]. Needs referral to dermatology for further evaluation. Have sent order to [facility].

## 2016-09-16 NOTE — Telephone Encounter (Signed)
Please advise 

## 2016-09-16 NOTE — Telephone Encounter (Signed)
Pt states he seen Dr Caryn Section last week and the Rx that he was given for his hands and feet is not working.  Pt is requesting something different.  [facility].  [place]#[phone]/[id number]

## 2016-10-26 ENCOUNTER — Other Ambulatory Visit: Payer: Self-pay | Admitting: Family Medicine

## 2016-10-26 DIAGNOSIS — E785 Hyperlipidemia, unspecified: Secondary | ICD-10-CM

## 2017-01-23 ENCOUNTER — Other Ambulatory Visit: Payer: Self-pay | Admitting: Family Medicine

## 2017-01-23 DIAGNOSIS — E785 Hyperlipidemia, unspecified: Secondary | ICD-10-CM

## 2017-02-26 ENCOUNTER — Encounter: Payer: Self-pay | Admitting: Family Medicine

## 2017-02-26 ENCOUNTER — Ambulatory Visit (INDEPENDENT_AMBULATORY_CARE_PROVIDER_SITE_OTHER): Payer: BLUE CROSS/BLUE SHIELD | Admitting: Family Medicine

## 2017-02-26 VITALS — BP 130/90 | HR 85 | Temp 98.4°F | Resp 16 | Wt 213.0 lb

## 2017-02-26 DIAGNOSIS — J329 Chronic sinusitis, unspecified: Secondary | ICD-10-CM

## 2017-02-26 MED ORDER — AMOXICILLIN-POT CLAVULANATE 875-125 MG PO TABS: 1.0000 | ORAL_TABLET | Freq: Two times a day (BID) | ORAL | 0 refills | Status: DC

## 2017-02-26 NOTE — Progress Notes (Signed)
Subjective:     Patient ID: Kyle Bishop, male   DOB: [birth year], 52 y.o.   MRN: [id number] Chief Complaint  Patient presents with  . Cough    Patient comes in office today with concerns of cough and congestion for the past 2 weeks. Patient states that cough is productive of dark yellow/green mucous. Patient states that he has had pressure in his chest due to cough, night sweats, runny nose, congestion and sinus pain/pressure. Patient has tried otc Tylenol for relief  . Rash    Patient reports for the past month he has had a rash break out under his left arm and spread to his sides and back. Patient reports that rash comes and goes and describes it as itchy and burning.    HPI Patient reports increased sinus pressure, purulent sinus drainage, post nasal drainage and accompanying cough. Reports rash is intermittent and will appear on his flank, inner upper arms and upper back at times. 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  Review of Systems     Objective:   Physical Exam  Constitutional: He appears well-developed and well-nourished. No distress.  Skin:  No rash apparent but see excoriation in areas noted above.  Ears: T.M's intact without inflammation Sinuses: non-tender Throat: no tonsillar enlargement or exudate Neck: no cervical adenopathy Lungs: clear     Assessment:    1. Sinusitis, unspecified chronicity, unspecified location - amoxicillin-clavulanate (AUGMENTIN) 875-125 MG tablet; Take 1 tablet by mouth 2 (two) times daily.  Dispense: 20 tablet; Refill: 0    Plan:   Discussed use of Mucinex D and Delsym. Start Claritin for ? Hives.

## 2017-02-26 NOTE — Patient Instructions (Signed)
Discussed use Mucinex D for congestion and Delsym for cough. Try Claritin for rash. Would like to see the rash when it occurs.

## 2017-03-21 IMAGING — MR MR HEAD WO/W CM
10 series · 46 of 48 positions shown · IV contrast (multihance)
Comparison: None.

CLINICAL DATA: Headache and visual disturbances. Blurred vision.
Duration of symptoms 5 months.

EXAM:
MRI HEAD WITHOUT AND WITH CONTRAST
TECHNIQUE: Multiplanar, multiecho pulse sequences of the brain and surrounding
structures were obtained without and with intravenous contrast.
CONTRAST:  20mL MULTIHANCE GADOBENATE DIMEGLUMINE 529 MG/ML IV SOLN

[Series 2: T1 · sagittal · 5.0mm · 0.45mm/px · 3 of 25 slices shown (1 of 2)]
[im 1/25]
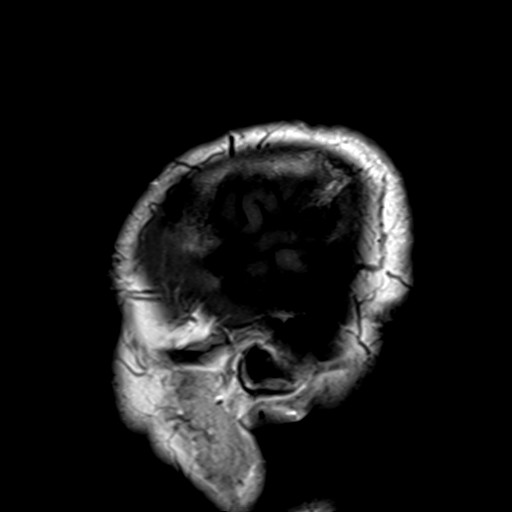
[im 13/25]
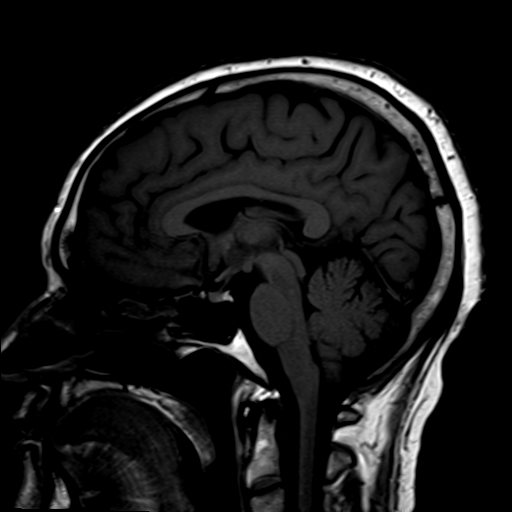
[im 25/25]
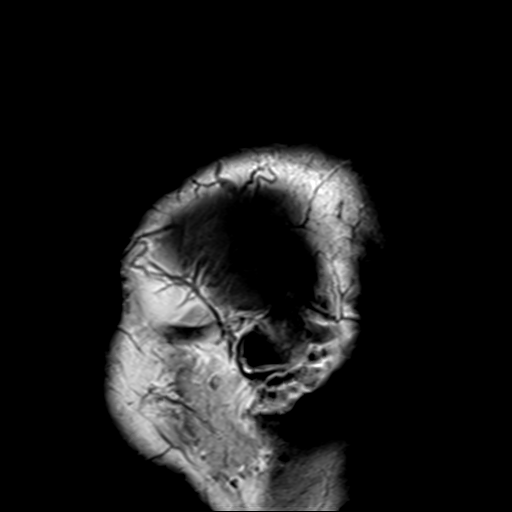

[Series 5: T2 · axial · 5.0mm · 0.60mm/px · z∈[-23,+133]mm · 3 of 25 slices shown (1 of 2)]
[im 1/25]
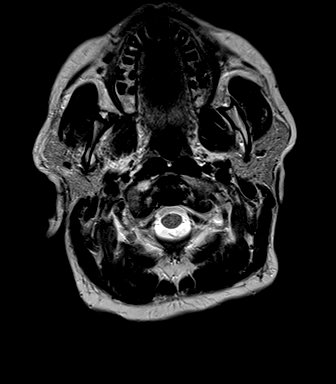
[im 13/25]
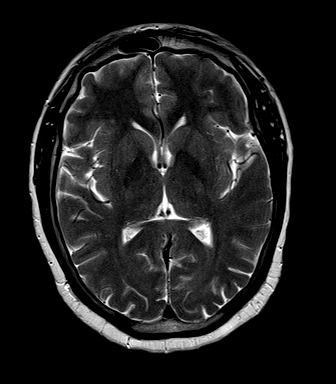
[im 25/25]
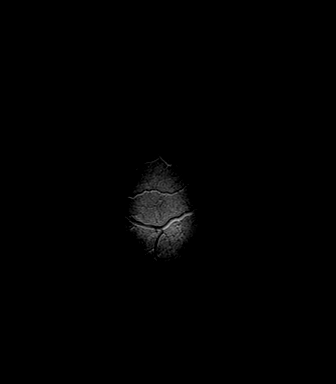

[Series 6: FLAIR · axial · 5.0mm · 0.45mm/px · z∈[-23,+133]mm · 3 of 25 slices shown]
[im 1/25]
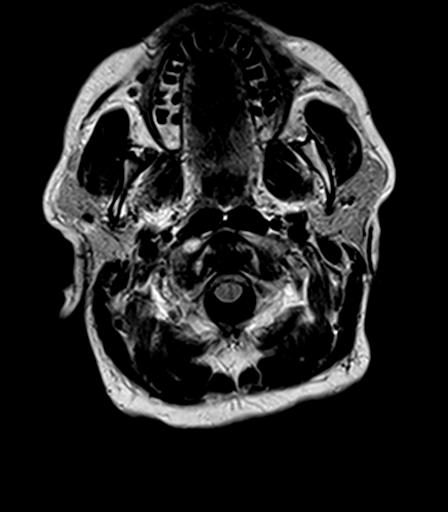
[im 13/25]
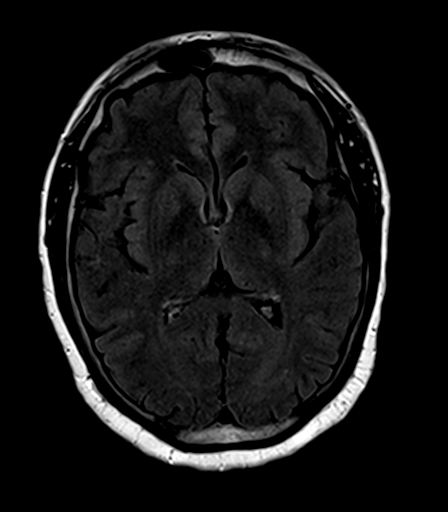
[im 25/25]
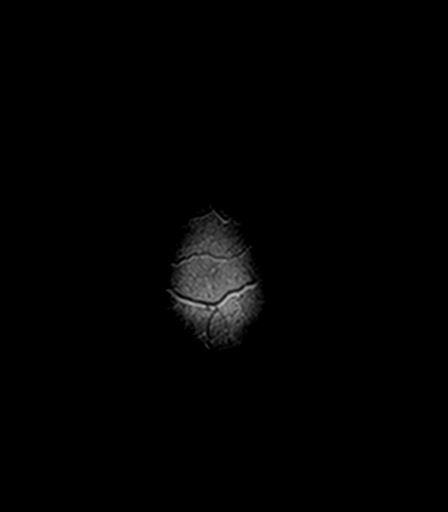

[Series 7: T2 · axial · 5.0mm · 0.45mm/px · z∈[-23,+133]mm · 3 of 25 slices shown (2 of 2)]
[im 1/25]
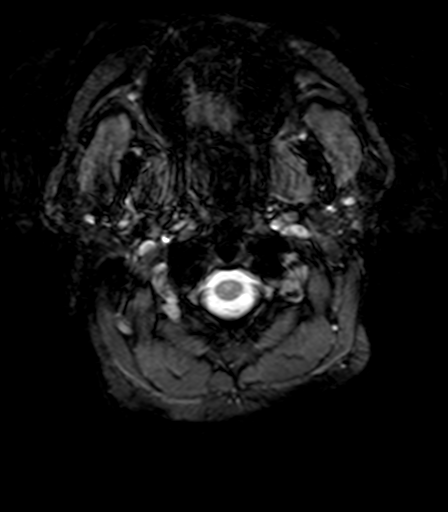
[im 13/25]
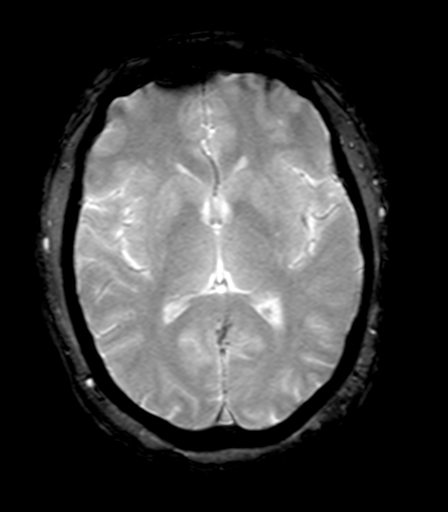
[im 25/25]
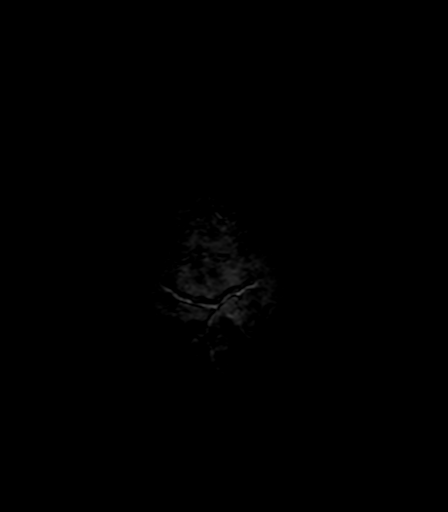

[Series 8: T1 · axial · 3.0mm · 1.00mm/px · z∈[-22,+80]mm · 5 of 52 slices shown (2 of 2)]
[im 1/52]
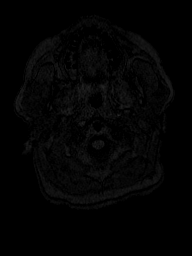
[im 9/52]
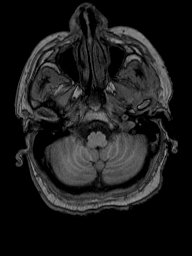
[im 18/52]
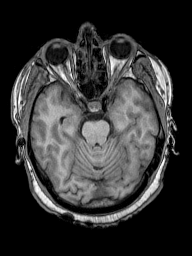
[im 26/52]
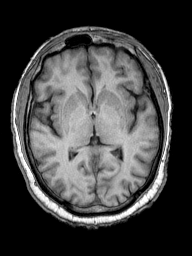
[im 35/52]
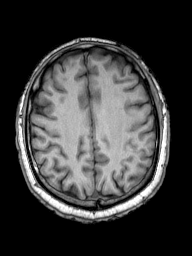

[Series 9: T2 post-contrast · coronal · 5.0mm · 0.49mm/px · 4 of 30 slices shown]
[im 1/30]
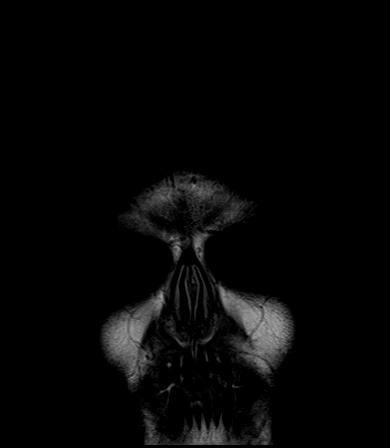
[im 10/30]
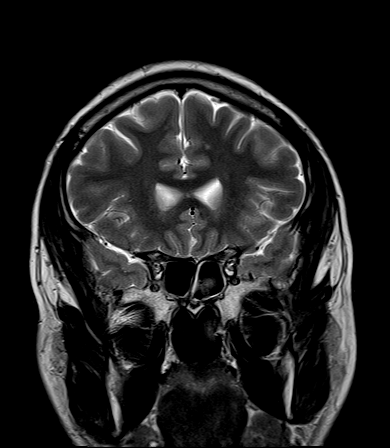
[im 20/30]
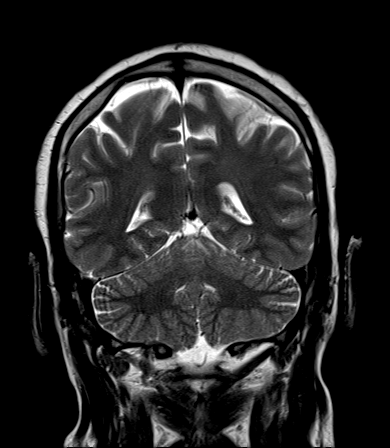
[im 30/30]
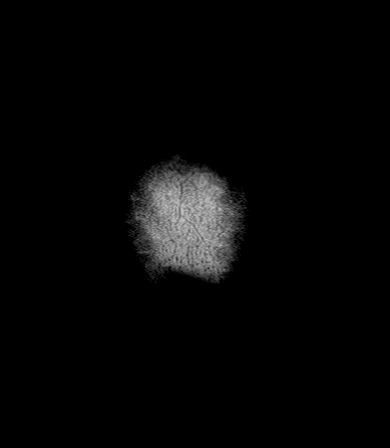

[Series 10: T1 post-contrast · axial · 3.0mm · 1.00mm/px · z∈[-22,+131]mm · 7 of 52 slices shown (1 of 2)]
[im 1/52]
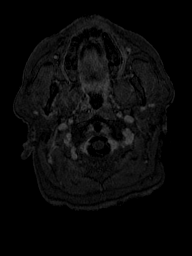
[im 9/52]
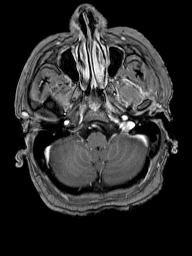
[im 18/52]
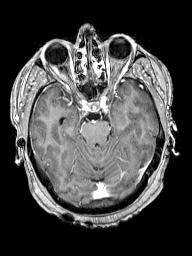
[im 26/52]
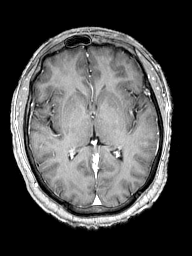
[im 35/52]
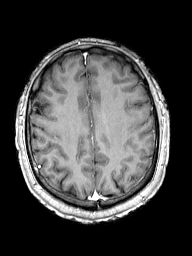
[im 43/52]
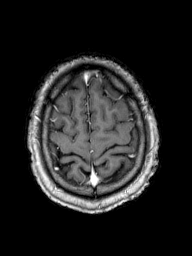
[im 52/52]
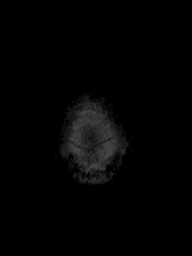

[Series 11: T1 post-contrast · coronal · 5.0mm · 0.43mm/px · 4 of 30 slices shown (2 of 2)]
[im 1/30]
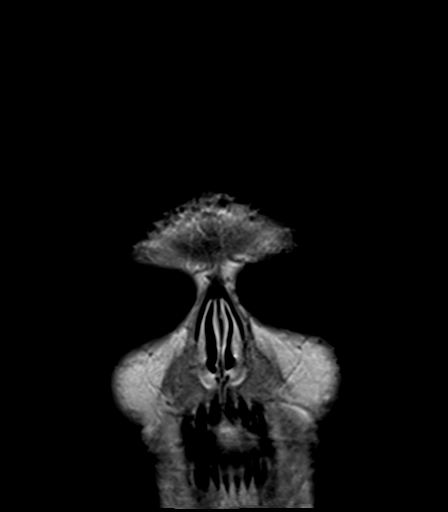
[im 10/30]
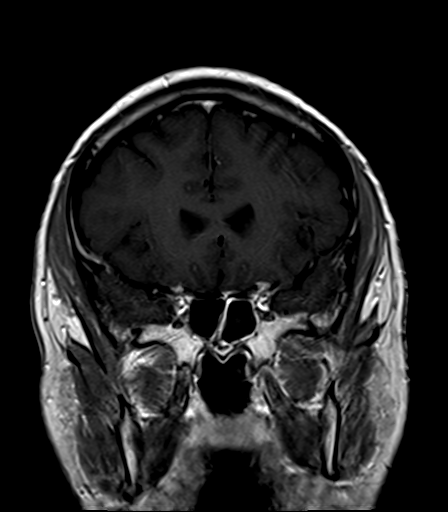
[im 20/30]
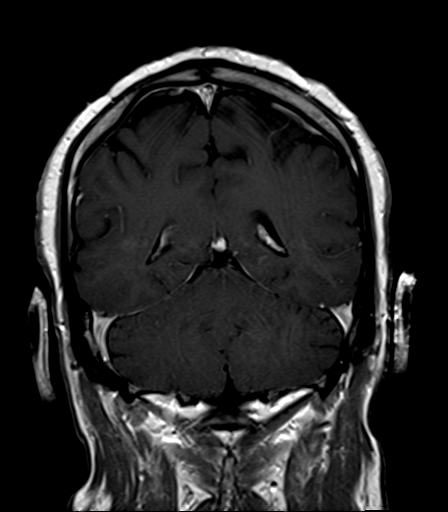
[im 30/30]
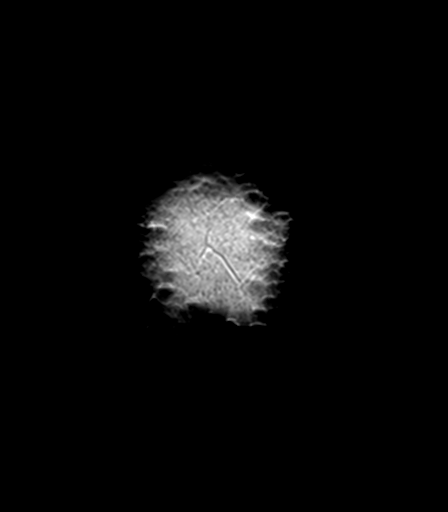

[Series 100: DWI · axial · 3.0mm · 1.80mm/px · z∈[-26,+136]mm · 7 of 55 slices shown]
[im 1/55]
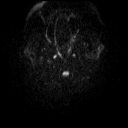
[im 10/55]
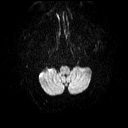
[im 19/55]
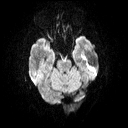
[im 28/55]
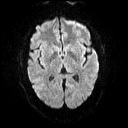
[im 37/55]
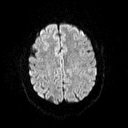
[im 46/55]
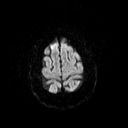
[im 55/55]
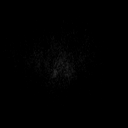

[Series 101: ADC · axial · 3.0mm · 1.80mm/px · z∈[-26,+136]mm · 7 of 54 slices shown]
[im 1/54]
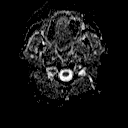
[im 9/54]
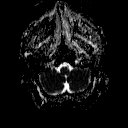
[im 18/54]
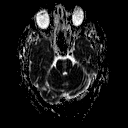
[im 27/54]
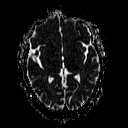
[im 36/54]
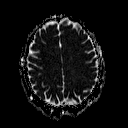
[im 45/54]
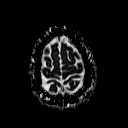
[im 54/54]
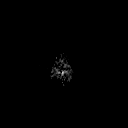

[46 of 48 positions shown; findings below may reference images not displayed]

FINDINGS: The brain has normal appearance without evidence of old or acute
infarction, mass lesion, hemorrhage, hydrocephalus or extra-axial
collection. The pituitary gland is normal. There is mucosal
inflammation affecting the paranasal sinuses. No fluid in the middle
ears or mastoids. No skull or skullbase lesion. Major vessels at the
base of the brain show flow. After contrast administration, no
abnormal enhancement occurs.
IMPRESSION: Normal MRI of the brain. No cause of the presenting symptoms is
identified.

Mild mucosal inflammation of the paranasal sinuses.

## 2017-09-28 ENCOUNTER — Encounter: Payer: Self-pay | Admitting: Family Medicine

## 2017-09-28 ENCOUNTER — Other Ambulatory Visit: Payer: Self-pay | Admitting: Family Medicine

## 2017-09-28 ENCOUNTER — Ambulatory Visit (INDEPENDENT_AMBULATORY_CARE_PROVIDER_SITE_OTHER): Payer: BLUE CROSS/BLUE SHIELD | Admitting: Family Medicine

## 2017-09-28 VITALS — BP 110/80 | HR 81 | Temp 98.1°F | Resp 16 | Wt 216.0 lb

## 2017-09-28 DIAGNOSIS — L509 Urticaria, unspecified: Secondary | ICD-10-CM | POA: Diagnosis not present

## 2017-09-28 MED ORDER — CETIRIZINE HCL 10 MG PO TABS: ORAL_TABLET | ORAL | 2 refills | Status: DC

## 2017-09-28 NOTE — Patient Instructions (Addendum)
We will call you with the allergy referral. May add Benadryl if cetirizine twice daily does not help.

## 2017-09-28 NOTE — Progress Notes (Signed)
  Subjective:     Patient ID: Kyle Bishop, male   DOB: Jun 08, 1965, 52 y.o.   MRN: [id number] Chief Complaint  Patient presents with  . Rash    Patient comes in office today with concerns of a possible rash that keeps popping up on his sk. Patient describes rash as like scratches that appears on skin and goes away after a matter of hours or days. Patient states that recently he noticed blisters on his skin and described it as burning and itching. Patient denies any change in foods, detergents or skin care.    HPI States he has noticed that the rash occurs on pressure areas of his body c/w pressure urticaria. Has tried Benadryl for his sx. Rash is not present today. Continues to work at [employer].  Review of Systems     Objective:   Physical Exam  Constitutional: He appears well-developed and well-nourished. No distress.  Pulmonary/Chest: Breath sounds normal. He has no wheezes.  Skin:  I am able to perceive mild dermatographism on his back with finger pressure.       Assessment:    1. Urticaria - cetirizine (ZYRTEC) 10 MG tablet; One pill twice daily for urticaria.  Dispense: 60 tablet; Refill: 2 - Ambulatory referral to Allergy    Plan:    He may also uses Benadryl as needed. Suggested he may have escalating doses of antihistamines per allergist protocol

## 2017-09-29 ENCOUNTER — Telehealth: Payer: Self-pay | Admitting: Family Medicine

## 2017-09-29 NOTE — Telephone Encounter (Signed)
FYI--Pt refused referral to allergist.He did not want to go to Dr Donneta Romberg because of bad experience.Referral out of town was offered.Pt states he does not want referral at present time

## 2017-09-29 NOTE — Telephone Encounter (Signed)
See below. KW 

## 2017-12-22 IMAGING — CR DG CHEST 2V
1 series · 2 of 2 positions shown · non-contrast
Comparison: PA and lateral chest x-ray May 24, 2012

CLINICAL DATA: Cough, shortness of breath, nocturnal fevers for the
past week. History of asthma

EXAM:
CHEST  2 VIEW

[Series 1: dg chest 2 view · 0.14mm/px · 2 of 2 slices shown]
[im 1/2]
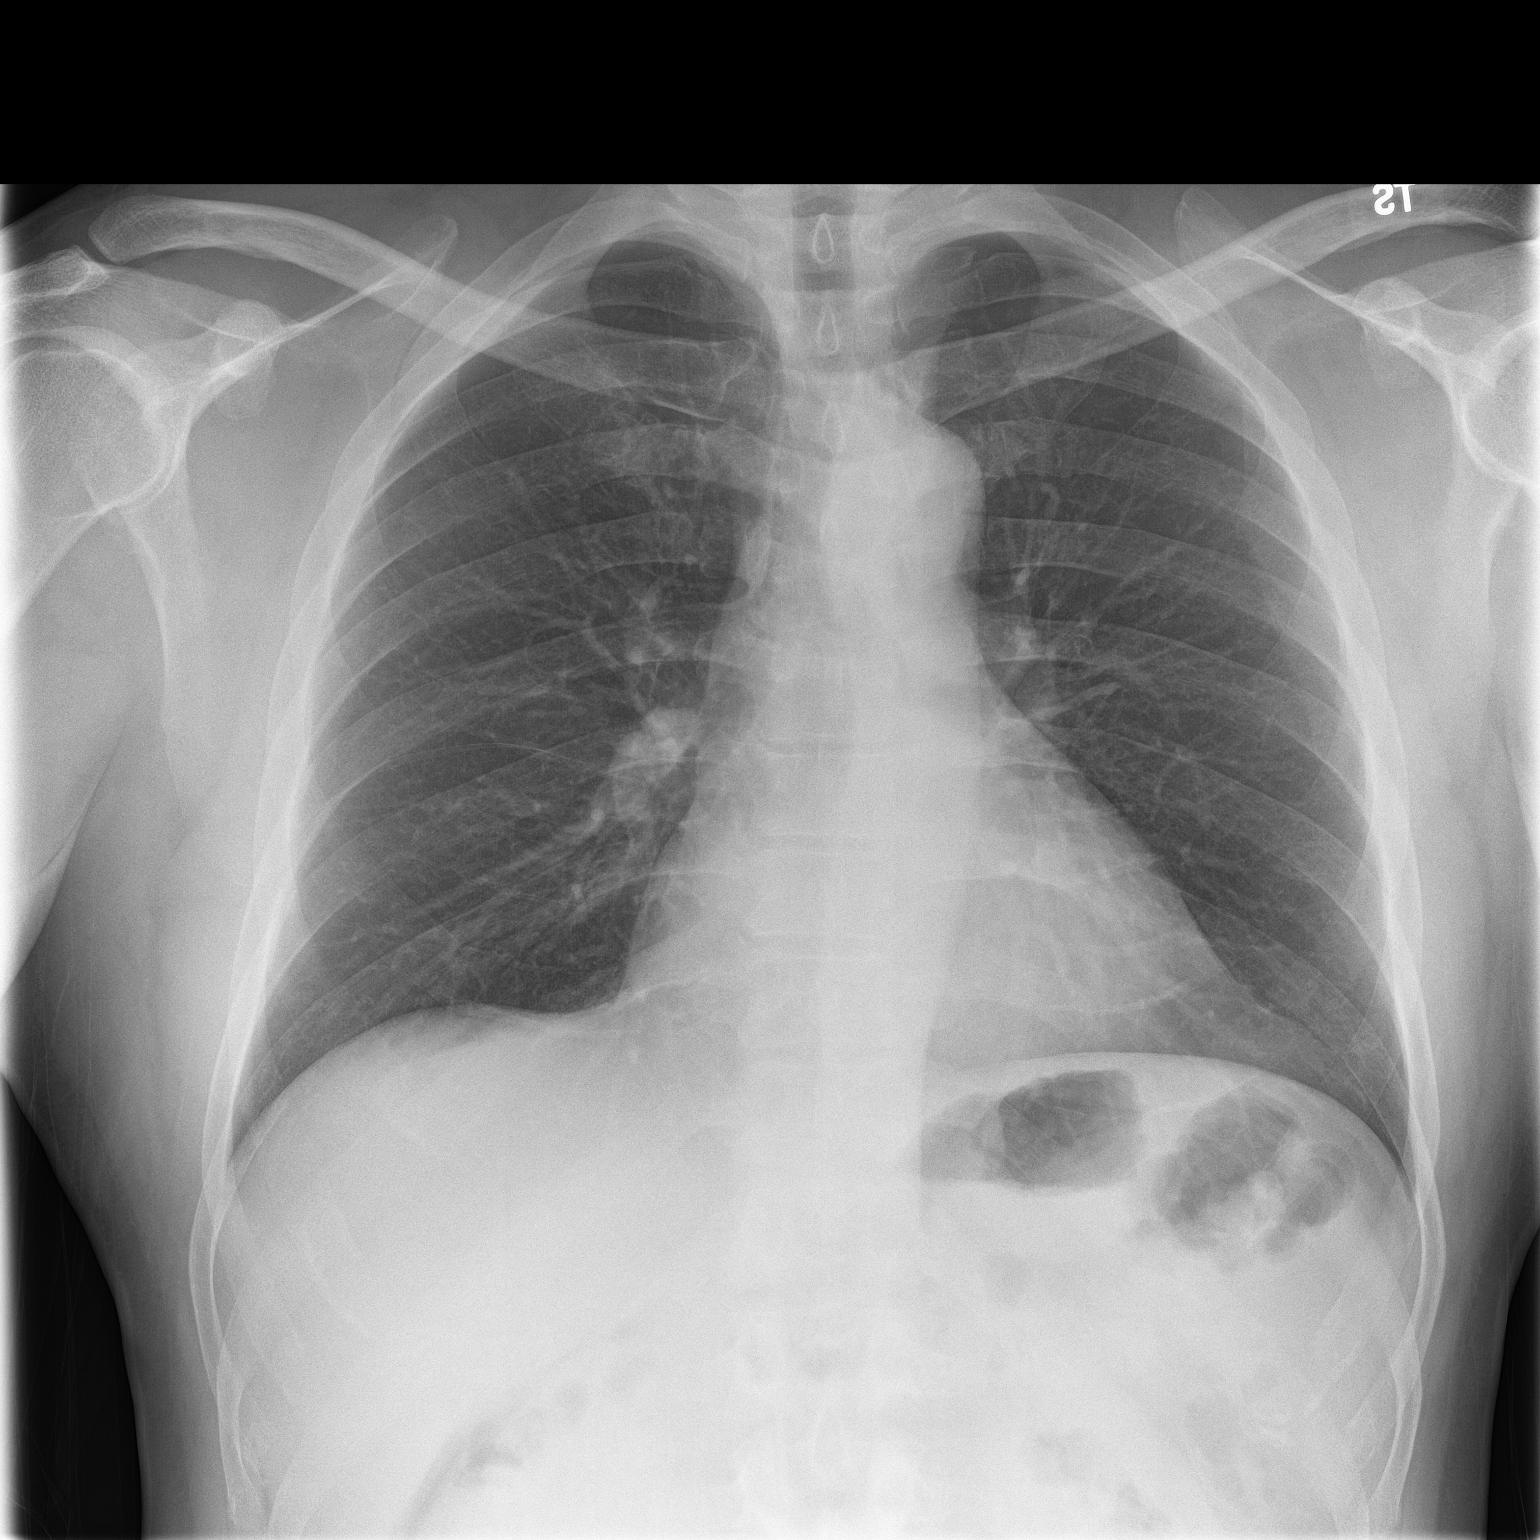
[im 2/2]
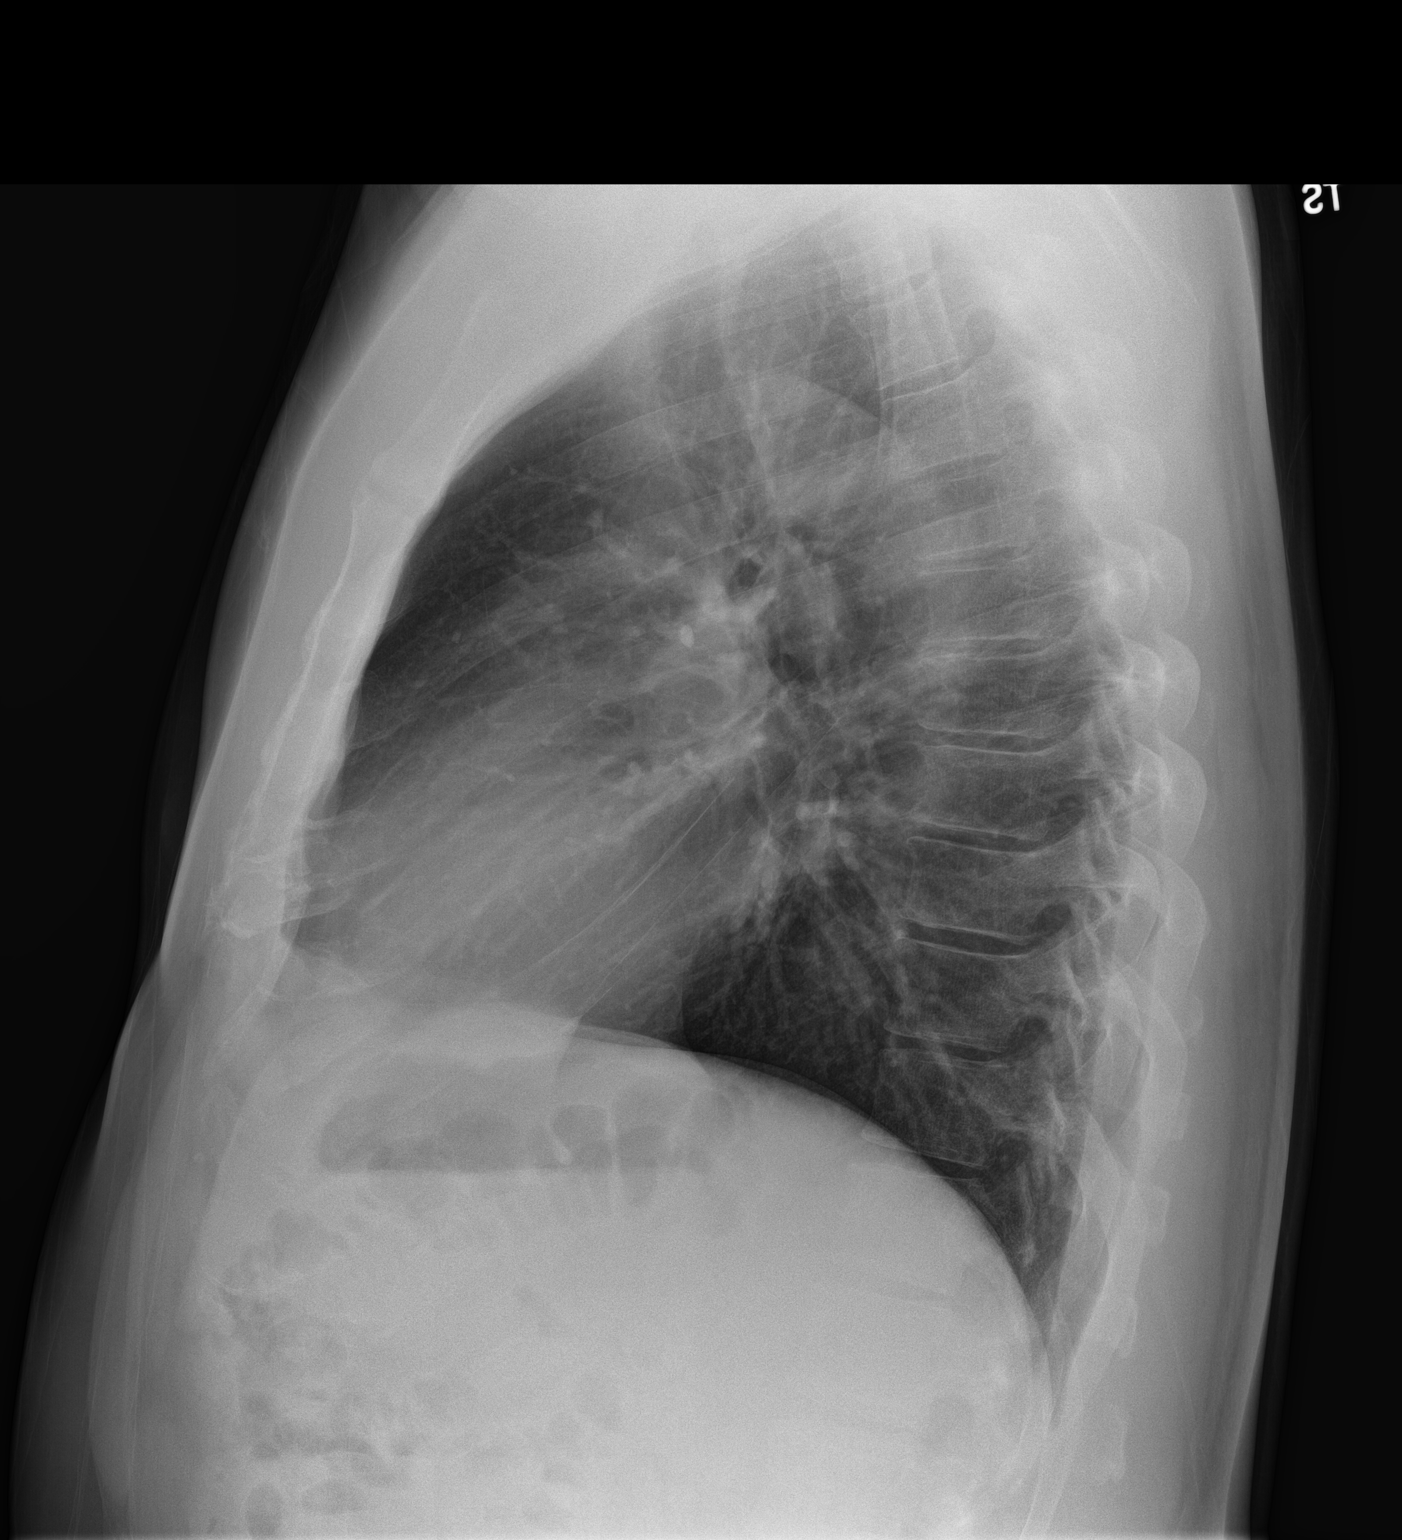

[2 of 2 positions shown; findings below may reference images not displayed]

FINDINGS: The lungs are well-expanded and clear. The heart and pulmonary
vascularity are normal. The mediastinum is normal in width. There is
no pleural effusion. There is calcification in the wall of the
aortic arch.
IMPRESSION: There is no acute cardiopulmonary abnormality.

Thoracic aortic atherosclerosis.

## 2018-09-15 ENCOUNTER — Ambulatory Visit (INDEPENDENT_AMBULATORY_CARE_PROVIDER_SITE_OTHER): Payer: BC Managed Care – PPO | Admitting: Physician Assistant

## 2018-09-15 ENCOUNTER — Ambulatory Visit
Admission: RE | Admit: 2018-09-15 | Discharge: 2018-09-15 | Disposition: A | Discharge status: Home / Self Care | Payer: BC Managed Care – PPO | Source: Ambulatory Visit | Attending: Physician Assistant | Admitting: Physician Assistant

## 2018-09-15 ENCOUNTER — Encounter: Payer: Self-pay | Admitting: Physician Assistant

## 2018-09-15 ENCOUNTER — Other Ambulatory Visit (HOSPITAL_COMMUNITY)
Admission: RE | Admit: 2018-09-15 | Discharge: 2018-09-15 | Disposition: A | Discharge status: Home / Self Care | Payer: BC Managed Care – PPO | Source: Ambulatory Visit | Attending: Physician Assistant | Admitting: Physician Assistant

## 2018-09-15 ENCOUNTER — Other Ambulatory Visit: Payer: Self-pay

## 2018-09-15 VITALS — BP 128/86 | HR 81 | Temp 97.1°F | Resp 16 | Ht 72.0 in | Wt 214.0 lb

## 2018-09-15 DIAGNOSIS — R319 Hematuria, unspecified: Secondary | ICD-10-CM | POA: Insufficient documentation

## 2018-09-15 DIAGNOSIS — R310 Gross hematuria: Secondary | ICD-10-CM

## 2018-09-15 DIAGNOSIS — Z87442 Personal history of urinary calculi: Secondary | ICD-10-CM

## 2018-09-15 LAB — POCT URINALYSIS DIPSTICK
Appearance: NORMAL
Bilirubin, UA: NEGATIVE
Glucose, UA: NEGATIVE
Ketones, UA: NEGATIVE
Leukocytes, UA: NEGATIVE
Nitrite, UA: NEGATIVE
Odor: NORMAL
Protein, UA: POSITIVE — AB
Spec Grav, UA: 1.01 (ref 1.010–1.025)
Urobilinogen, UA: 0.2 E.U./dL
pH, UA: 6.5 (ref 5.0–8.0)

## 2018-09-15 MED ORDER — SULFAMETHOXAZOLE-TRIMETHOPRIM 800-160 MG PO TABS: 1.0000 | ORAL_TABLET | Freq: Two times a day (BID) | ORAL | 0 refills | Status: AC

## 2018-09-15 NOTE — Progress Notes (Signed)
Patient: Kyle Bishop Male    DOB: 12/21/1965   53 y.o.   MRN: [id number] Visit Date: 09/15/2018  Today's Provider: Trinna Post, PA-C   Chief Complaint  Patient presents with  . Hematuria   Subjective:    Patient with history of kidney stones presenting as below.   Hematuria This is a new problem. The current episode started in the past 7 days (2 days). He reports clotting at the beginning and end of his urine stream. Obstructive symptoms do not include dribbling, incomplete emptying, an intermittent stream, a slower stream, straining or a weak stream. Associated symptoms include dysuria. Pertinent negatives include no abdominal pain, chills, facial swelling, fever, flank pain, genital pain, hesitancy, inability to urinate, nausea or vomiting.   Patient has had blood in his urine for 2 days. Patient states he has also had slight burning upon urination. Patient states when he first noticed the blood it was a moderate amount but now his urine is almost back to normal.  Last kidney stone was very remote. He denies new sexual partners. He does not currently and has never smoked.    No Known Allergies   Current Outpatient Medications:  .  lovastatin (MEVACOR) 20 MG tablet, TAKE 1 TABLET AT BEDTIME (NEEDS TO SCHEDULE OFFICE VISIT FOR FOLLOW UP), Disp: 30 tablet, Rfl: 0 .  cetirizine (ZYRTEC) 10 MG tablet, One pill twice daily for urticaria., Disp: 60 tablet, Rfl: 2  Review of Systems  Constitutional: Negative for appetite change, chills and fever.  HENT: Negative for facial swelling.   Respiratory: Negative for chest tightness, shortness of breath and wheezing.   Cardiovascular: Negative for chest pain and palpitations.  Gastrointestinal: Negative for abdominal pain, nausea and vomiting.  Genitourinary: Positive for dysuria and hematuria. Negative for flank pain, hesitancy and incomplete emptying.    Social History   Tobacco Use  . Smoking status: Never Smoker  .  Smokeless tobacco: Never Used  Substance Use Topics  . Alcohol use: No    Alcohol/week: 0.0 standard drinks      Objective:   BP 128/86 (BP Location: Left Arm, Patient Position: Sitting, Cuff Size: Large)   Pulse 81   Temp (!) 97.1 F (36.2 C) (Other (Comment))   Resp 16   Ht 6' (1.829 m)   Wt 214 lb (97.1 kg)   SpO2 96%   BMI 29.02 kg/m  Vitals:   09/15/18 1341  BP: 128/86  Pulse: 81  Resp: 16  Temp: (!) 97.1 F (36.2 C)  TempSrc: Other (Comment)  SpO2: 96%  Weight: 214 lb (97.1 kg)  Height: 6' (1.829 m)     Physical Exam Constitutional:      Appearance: Normal appearance.  Cardiovascular:     Rate and Rhythm: Normal rate and regular rhythm.     Heart sounds: Normal heart sounds.  Pulmonary:     Effort: Pulmonary effort is normal.     Breath sounds: Normal breath sounds.  Abdominal:     General: Abdomen is flat. Bowel sounds are normal.     Palpations: Abdomen is soft.     Tenderness: There is no right CVA tenderness or left CVA tenderness.  Skin:    General: Skin is warm and dry.  Neurological:     Mental Status: He is alert and oriented to person, place, and time. Mental status is at baseline.  Psychiatric:        Mood and Affect: Mood normal.  Behavior: Behavior normal.      No results found for any visits on 09/15/18.     Assessment & Plan    1. Hematuria, unspecified type  DDx: kidney stone, urethritis or cystitis, STI, malignancy. Workup as below. Followup pending results, cover for infection with bactrim.   - POCT urinalysis dipstick - Comprehensive Metabolic Panel (CMET) - DG Abd 1 View; Future - Urine cytology ancillary only - CULTURE, URINE COMPREHENSIVE - Urinalysis, microscopic only - CBC With Differential  2. Gross hematuria  - sulfamethoxazole-trimethoprim (BACTRIM DS) 800-160 MG tablet; Take 1 tablet by mouth 2 (two) times daily for 7 days.  Dispense: 14 tablet; Refill: 0 - DG Abd 1 View; Future - Urine cytology  ancillary only - CULTURE, URINE COMPREHENSIVE - Urinalysis, microscopic only - CBC With Differential  3. History of kidney stones  - DG Abd 1 View; Future - Urine cytology ancillary only - CULTURE, URINE COMPREHENSIVE - Urinalysis, microscopic only  The entirety of the information documented in the History of Present Illness, Review of Systems and Physical Exam were personally obtained by me. Portions of this information were initially documented by April M. Sabra Heck, CMA and reviewed by me for thoroughness and accuracy.          Trinna Post, PA-C  [facility]

## 2018-09-15 NOTE — Patient Instructions (Signed)
Hematuria, Adult Hematuria is blood in the urine. Blood may be visible in the urine, or it may be identified with a test. This condition can be caused by infections of the bladder, urethra, kidney, or prostate. Other possible causes include:  Kidney stones.  Cancer of the urinary tract.  Too much calcium in the urine.  Conditions that are passed from parent to child (inherited conditions).  Exercise that requires a lot of energy. Infections can usually be treated with medicine, and a kidney stone usually will pass through your urine. If neither of these is the cause of your hematuria, more tests may be needed to identify the cause of your symptoms. It is very important to tell your health care provider about any blood in your urine, even if it is painless or the blood stops without treatment. Blood in the urine, when it happens and then stops and then happens again, can be a symptom of a very serious condition, including cancer. There is no pain in the initial stages of many urinary cancers. Follow these instructions at home: Medicines  Take over-the-counter and prescription medicines only as told by your health care provider.  If you were prescribed an antibiotic medicine, take it as told by your health care provider. Do not stop taking the antibiotic even if you start to feel better. Eating and drinking  Drink enough fluid to keep your urine clear or pale yellow. It is recommended that you drink 3-4 quarts (2.8-3.8 L) a day. If you have been diagnosed with an infection, it is recommended that you drink cranberry juice in addition to large amounts of water.  Avoid caffeine, tea, and carbonated beverages. These tend to irritate the bladder.  Avoid alcohol because it may irritate the prostate (men). General instructions  If you have been diagnosed with a kidney stone, follow your health care provider's instructions about straining your urine to catch the stone.  Empty your bladder  often. Avoid holding urine for long periods of time.  If you are male: ? After a bowel movement, wipe from front to back and use each piece of toilet paper only once. ? Empty your bladder before and after sex.  Pay attention to any changes in your symptoms. Tell your health care provider about any changes or any new symptoms.  It is your responsibility to get your test results. Ask your health care provider, or the department performing the test, when your results will be ready.  Keep all follow-up visits as told by your health care provider. This is important. Contact a health care provider if:  You develop back pain.  You have a fever.  You have nausea or vomiting.  Your symptoms do not improve after 3 days.  Your symptoms get worse. Get help right away if:  You develop severe vomiting and are unable take medicine without vomiting.  You develop severe pain in your back or abdomen even though you are taking medicine.  You pass a large amount of blood in your urine.  You pass blood clots in your urine.  You feel very weak or like you might faint.  You faint. Summary  Hematuria is blood in the urine. It has many possible causes.  It is very important that you tell your health care provider about any blood in your urine, even if it is painless or the blood stops without treatment.  Take over-the-counter and prescription medicines only as told by your health care provider.  Drink enough fluid to keep   your urine clear or pale yellow. This information is not intended to replace advice given to you by your health care provider. Make sure you discuss any questions you have with your health care provider. Document Released: 01/06/2005 Document Revised: 12/19/2016 Document Reviewed: 02/09/2016 Elsevier Patient Education  2020 Elsevier Inc.  

## 2018-09-16 ENCOUNTER — Other Ambulatory Visit: Payer: Self-pay | Admitting: Family Medicine

## 2018-09-16 DIAGNOSIS — L509 Urticaria, unspecified: Secondary | ICD-10-CM

## 2018-09-16 LAB — COMPREHENSIVE METABOLIC PANEL
ALT: 23 IU/L (ref 0–44)
AST: 17 IU/L (ref 0–40)
Albumin/Globulin Ratio: 2.1 (ref 1.2–2.2)
Albumin: 4.4 g/dL (ref 3.8–4.9)
Alkaline Phosphatase: 83 IU/L (ref 39–117)
BUN/Creatinine Ratio: 14 (ref 9–20)
BUN: 14 mg/dL (ref 6–24)
Bilirubin Total: 0.5 mg/dL (ref 0.0–1.2)
CO2: 23 mmol/L (ref 20–29)
Calcium: 9.6 mg/dL (ref 8.7–10.2)
Chloride: 101 mmol/L (ref 96–106)
Creatinine, Ser: 1 mg/dL (ref 0.76–1.27)
GFR calc Af Amer: 98 mL/min/{1.73_m2} (ref >59)
GFR calc non Af Amer: 85 mL/min/{1.73_m2} (ref >59)
Globulin, Total: 2.1 g/dL (ref 1.5–4.5)
Glucose: 88 mg/dL (ref 65–99)
Potassium: 4.4 mmol/L (ref 3.5–5.2)
Sodium: 142 mmol/L (ref 134–144)
Total Protein: 6.5 g/dL (ref 6.0–8.5)

## 2018-09-16 LAB — URINALYSIS, MICROSCOPIC ONLY
Casts: NONE SEEN /lpf
Epithelial Cells (non renal): NONE SEEN /hpf (ref 0–10)

## 2018-09-16 MED ORDER — CETIRIZINE HCL 10 MG PO TABS: ORAL_TABLET | ORAL | 2 refills | Status: DC

## 2018-09-16 NOTE — Telephone Encounter (Signed)
[facility] faxed refill request for the following medications:  cetirizine (ZYRTEC) 10 MG tablet   Please advise.  

## 2018-09-17 LAB — URINE CYTOLOGY ANCILLARY ONLY
Chlamydia: NEGATIVE
Neisseria Gonorrhea: NEGATIVE
Trichomonas: NEGATIVE

## 2018-09-18 LAB — CULTURE, URINE COMPREHENSIVE

## 2019-02-01 ENCOUNTER — Encounter: Payer: BC Managed Care – PPO | Admitting: Family Medicine

## 2019-02-15 ENCOUNTER — Ambulatory Visit: Payer: BC Managed Care – PPO | Attending: Internal Medicine

## 2019-02-15 DIAGNOSIS — Z20822 Contact with and (suspected) exposure to covid-19: Secondary | ICD-10-CM

## 2019-02-16 ENCOUNTER — Telehealth: Payer: Self-pay

## 2019-02-16 LAB — NOVEL CORONAVIRUS, NAA: SARS-CoV-2, NAA: DETECTED — AB

## 2019-02-16 NOTE — Telephone Encounter (Signed)
Patient called in wanting to confirm his [place] lab results - DOB/Address verified - confirmed positive results. Reviewed Patient Positive Test results and MyChart Companion Algorithm for Home monitoring of COVID19 with patient/spouse.   Patient reports the following the symptoms: cough(dry) and fever - has resolved.   Patient verbalized understanding of instructions/directions, no further questions.

## 2019-02-17 ENCOUNTER — Encounter: Payer: Self-pay | Admitting: Family Medicine

## 2020-12-25 NOTE — Progress Notes (Signed)
12/26/20 8:37 AM   Filippo L Helfrich 1965/08/05 [id number]  Referring provider:  Kirk Ruths, MD [facility] [place],  [place] [address] Chief Complaint  Patient presents with   Penis Pain     HPI: Kyle Bishop is a 55 y.o.male who presents today for further evaluation of nodule on the bottom of the glans/end shaft of penis.   He was seen on 12/12/2020 by his PCP, Dr. Ouida Sills, complaining of several weeks of waxing and waning nodule on penis. Exam showed a couple small penile nodules and minimal redness around shaft. STI testing was negative and HIV testing was negative.   He reports today that he went to his PCP and had testing for the area on his penis and all test came back normal. He reports the spot on his penis started a week ago. He reports that the spot has went away he was given cream and he has been using it. He was very anxious today about this spot.   He does report today that his wife started using a new detergent.  He also had a rash on his legs.  He wonders if this was related.  PMH: Past Medical History:  Diagnosis Date   History of chicken pox    History of measles    History of mumps    Hyperlipidemia     Surgical History: No past surgical history on file.  Home Medications:  Allergies as of 12/26/2020   No Known Allergies      Medication List        Accurate as of December 26, 2020  8:37 AM. If you have any questions, ask your nurse or doctor.          amoxicillin-clavulanate 875-125 MG tablet Commonly known as: AUGMENTIN Take 1 tablet by mouth 2 (two) times daily.   cetirizine 10 MG tablet Commonly known as: ZYRTEC One pill twice daily for urticaria.   clotrimazole-betamethasone cream Commonly known as: LOTRISONE Apply topically 2 (two) times daily.   lovastatin 20 MG tablet Commonly known as: MEVACOR Take by mouth.   pantoprazole 40 MG tablet Commonly known as: PROTONIX Take by  mouth.        Allergies: No Known Allergies  Family History: Family History  Problem Relation Age of Onset   Diabetes Mother        TYPE 2   Hypertension Mother    Heart attack Father    Diabetes Sister        TYPE 2   Heart attack Brother    Arthritis Son    Crohn's disease Son     Social History:  reports that he has never smoked. He has never used smokeless tobacco. He reports that he does not drink alcohol and does not use drugs.   Physical Exam: BP (!) 155/103   Pulse 77   Ht 6\' 1"  (1.854 m)   Wt 208 lb (94.3 kg)   BMI 27.44 kg/m   Constitutional:  Alert and oriented, No acute distress. HEENT:  AT, moist mucus membranes.  Trachea midline, no masses. Cardiovascular: No clubbing, cyanosis, or edema. Respiratory: Normal respiratory effort, no increased work of breathing. GU: Exam normal today, circumcised phallus, no nodules, erythema, no rash.  He points to the left side of his glans where the rash had been located.  There are 2 very tiny pearly papules at the 12 o'clock position on the coronal margin. Skin: No rashes, bruises  or suspicious lesions. Neurologic: Grossly intact, no focal deficits, moving all 4 extremities. Psychiatric: Normal mood and affect.  Laboratory Data:  Lab Results  Component Value Date   CREATININE 1.00 09/15/2018   Lab Results  Component Value Date   PSA 0.3 10/25/2012    Assessment & Plan:    Penile rash  - Resolved with Lotrisone cream  -Benign pearly papules present, nonpathologic - Exam today reassuring  - Return if this reoccurs   Follow-up as needed   I,Kailey Littlejohn,acting as a scribe for Hollice Espy, MD.,have documented all relevant documentation on the behalf of Hollice Espy, MD,as directed by  Hollice Espy, MD while in the presence of Hollice Espy, MD.  I have reviewed the above documentation for accuracy and completeness, and I agree with the above.   Hollice Espy, MD   [facility] [address] [place], [place] [address] [phone]

## 2020-12-26 ENCOUNTER — Encounter: Payer: Self-pay | Admitting: Urology

## 2020-12-26 ENCOUNTER — Other Ambulatory Visit: Payer: Self-pay

## 2020-12-26 ENCOUNTER — Ambulatory Visit: Payer: BC Managed Care – PPO | Admitting: Urology

## 2020-12-26 VITALS — BP 155/103 | HR 77 | Ht 73.0 in | Wt 208.0 lb

## 2020-12-26 DIAGNOSIS — N489 Disorder of penis, unspecified: Secondary | ICD-10-CM | POA: Diagnosis not present

## 2020-12-26 LAB — URINALYSIS, COMPLETE
Bilirubin, UA: NEGATIVE
Glucose, UA: NEGATIVE
Ketones, UA: NEGATIVE
Leukocytes,UA: NEGATIVE
Nitrite, UA: NEGATIVE
Protein,UA: NEGATIVE
Specific Gravity, UA: 1.02 (ref 1.005–1.030)
Urobilinogen, Ur: 0.2 mg/dL (ref 0.2–1.0)
pH, UA: 5.5 (ref 5.0–7.5)

## 2022-03-17 ENCOUNTER — Other Ambulatory Visit: Payer: Self-pay | Admitting: Internal Medicine

## 2022-03-17 DIAGNOSIS — R7989 Other specified abnormal findings of blood chemistry: Secondary | ICD-10-CM

## 2022-03-19 ENCOUNTER — Ambulatory Visit
Admission: RE | Admit: 2022-03-19 | Discharge: 2022-03-19 | Disposition: A | Discharge status: Home / Self Care | Payer: BC Managed Care – PPO | Source: Ambulatory Visit | Attending: Internal Medicine | Admitting: Internal Medicine

## 2022-03-19 DIAGNOSIS — R7989 Other specified abnormal findings of blood chemistry: Secondary | ICD-10-CM | POA: Diagnosis present

## 2022-04-29 ENCOUNTER — Ambulatory Visit: Payer: BC Managed Care – PPO | Admitting: Urology

## 2022-04-29 ENCOUNTER — Encounter: Payer: Self-pay | Admitting: Urology

## 2022-04-29 VITALS — BP 139/95 | HR 60 | Ht 73.0 in | Wt 215.0 lb

## 2022-04-29 DIAGNOSIS — R319 Hematuria, unspecified: Secondary | ICD-10-CM

## 2022-04-29 DIAGNOSIS — N1330 Unspecified hydronephrosis: Secondary | ICD-10-CM

## 2022-04-29 LAB — URINALYSIS, COMPLETE
Bilirubin, UA: NEGATIVE
Glucose, UA: NEGATIVE
Ketones, UA: NEGATIVE
Leukocytes,UA: NEGATIVE
Nitrite, UA: NEGATIVE
Protein,UA: NEGATIVE
Specific Gravity, UA: >1.03 — ABNORMAL HIGH (ref 1.005–1.030)
Urobilinogen, Ur: 0.2 mg/dL (ref 0.2–1.0)
pH, UA: 5 (ref 5.0–7.5)

## 2022-04-29 LAB — MICROSCOPIC EXAMINATION

## 2022-04-29 NOTE — Progress Notes (Signed)
I, Amy L Pierron,acting as a scribe for Kyle Scotland, MD.,have documented all relevant documentation on the behalf of Kyle Scotland, MD,as directed by  Kyle Scotland, MD while in the presence of Kyle Scotland, MD.  04/29/2022 1:36 PM   Kyle Bishop [birth year] [id number]  Referring provider: Lauro Regulus, MD [address] [facility] [place],  [place] [address]  Chief Complaint  Patient presents with   Hydronephrosis    HPI: 57 year-old male referred for further evaluation of incidental left-sided hydronephrosis.   He was seen back in 2022 for concern of penile rash.   He was known to have elevated  LFT's and underwent an abdominal ultrasound that was completed on 03/19/2022. This showed a left renal cyst measuring 4.5 centimeters and incidental left hydronephrosis. It appears that the imaging quality is relatively poor in terms of imaging of the kidney. He has no previous renal imaging for review. His most recent creatinine on February 11, 2022, was normal at 1.1. He also had a urinalysis, which was the [facility] 4 red blood cells for a high-power field.   He reports being told he needed to be seen because he has a fatty liver. He has noticed hematuria. He mentioned having kidney stones about 15-17 years ago. Denies any history of smoking and drinking. Denies pain commonly associated with a kidney stone at this time. Denies any other urinary symptoms.   PMH: Past Medical History:  Diagnosis Date   History of chicken pox    History of measles    History of mumps    Hyperlipidemia     Home Medications:  Allergies as of 04/29/2022   No Known Allergies      Medication List        Accurate as of April 29, 2022  1:36 PM. If you have any questions, ask your nurse or doctor.          STOP taking these medications    amoxicillin-clavulanate 875-125 MG tablet Commonly known as: AUGMENTIN Stopped by: Kyle Scotland, MD       TAKE these  medications    cetirizine 10 MG tablet Commonly known as: ZYRTEC One pill twice daily for urticaria.   clotrimazole-betamethasone cream Commonly known as: LOTRISONE Apply topically 2 (two) times daily.   lovastatin 20 MG tablet Commonly known as: MEVACOR Take by mouth.   propranolol 40 MG tablet Commonly known as: INDERAL Take 1 tablet by mouth 2 (two) times daily.   vancomycin 250 MG capsule Commonly known as: VANCOCIN Take 250 mg by mouth every 6 (six) hours.        Family History: Family History  Problem Relation Age of Onset   Diabetes Mother        TYPE 2   Hypertension Mother    Heart attack Father    Diabetes Sister        TYPE 2   Heart attack Brother    Arthritis Son    Crohn's disease Son     Social History:  reports that he has never smoked. He has never used smokeless tobacco. He reports that he does not drink alcohol and does not use drugs.   Physical Exam: BP (!) 139/95   Pulse 60   Ht 6\' 1"  (1.854 m)   Wt 215 lb (97.5 kg)   BMI 28.37 kg/m   Constitutional:  Alert and oriented, No acute distress. HEENT: [place] AT, moist mucus membranes.  Trachea midline, no masses. Neurologic:  Grossly intact, no focal deficits, moving all 4 extremities. Psychiatric: Normal mood and affect.  Laboratory Data: Urinalysis    Component Value Date/Time   APPEARANCEUR Clear 04/29/2022 0947   GLUCOSEU Negative 04/29/2022 0947   BILIRUBINUR Negative 04/29/2022 0947   PROTEINUR Negative 04/29/2022 0947   UROBILINOGEN 0.2 09/15/2018 1348   NITRITE Negative 04/29/2022 0947   LEUKOCYTESUR Negative 04/29/2022 0947    Lab Results  Component Value Date   LABMICR See below: 04/29/2022   WBCUA 0-5 04/29/2022   LABEPIT 0-10 04/29/2022   MUCUS Present (A) 04/29/2022   BACTERIA Few 04/29/2022    Pertinent Imaging: Narrative & Impression  CLINICAL DATA:  Abnormal LFTs   EXAM: ABDOMEN ULTRASOUND COMPLETE   COMPARISON:  None Available.   FINDINGS: Gallbladder:  No gallstones or wall thickening visualized. No sonographic Murphy sign noted by sonographer.   Common bile duct: Diameter: 2.4 mm   Liver: Mild increased echogenicity. No focal mass. Portal vein is patent on color Doppler imaging with normal direction of blood flow towards the liver.   IVC: Not well visualized due to shadowing bowel gas.   Pancreas: Not well visualized due to shadowing bowel gas.   Spleen: Size and appearance within normal limits.   Right Kidney: Length: 12.2 cm. Echogenicity within normal limits. No mass or hydronephrosis visualized.   Left Kidney: Length: 13.4 cm. Contains a 4.5 cm cyst. No follow-up imaging recommended for the cyst. Mild hydronephrosis.   Abdominal aorta: Calcified atherosclerotic change in the nonaneurysmal aorta.   Other findings: None.   IMPRESSION: 1. Mild increased echogenicity in the liver is nonspecific but often due to hepatic steatosis. 2. Mild left hydronephrosis. 3. Calcified atherosclerotic change in the nonaneurysmal aorta. 4. The IVC and pancreas are not well visualized due to shadowing bowel gas.       Electronically Signed   By: Gerome Sam III M.D.   On: 03/19/2022 19:06  Personally reviewed today and agree with radiologic interpretation.    Assessment & Plan:    Left hydronephrosis  - Explained possible reasons for having blood and swelling of the kidney include having a ureter stone or cancer amongst other possible dx.  - Recommend CT urogram and cystoscopy to determine any underlying causes.   - Explained how the procedures are done and he is in agreement with pursuing this.   Microscopic hematuria  - Urine today is negative.  -In the setting of incidental hydronephrosis and previous episode of microscopic hematuria along with possible gross hematuria, recommend pursuing hematuria evaluation as outlined above  Return in about 4 weeks (around 05/27/2022) for CT urogram and cysto.  I have reviewed the  above documentation for accuracy and completeness, and I agree with the above.   Kyle Scotland, MD   [facility] [address] [place], [place] [address] [phone]

## 2022-04-29 NOTE — Progress Notes (Signed)
Kyle Bishop presents for an office/procedure visit. BP today is __4/09/2022. He is complaint with BP medication. Greater than 140/90. Provider  notified. Pt advised to see PCP. Pt voiced understanding.

## 2022-06-04 ENCOUNTER — Ambulatory Visit: Payer: BC Managed Care – PPO | Admitting: Urology

## 2022-06-04 VITALS — BP 138/96 | HR 61 | Ht 73.0 in | Wt 215.0 lb

## 2022-06-04 DIAGNOSIS — N1330 Unspecified hydronephrosis: Secondary | ICD-10-CM

## 2022-06-04 DIAGNOSIS — R319 Hematuria, unspecified: Secondary | ICD-10-CM

## 2022-06-04 LAB — URINALYSIS, COMPLETE
Bilirubin, UA: NEGATIVE
Glucose, UA: NEGATIVE
Ketones, UA: NEGATIVE
Leukocytes,UA: NEGATIVE
Nitrite, UA: NEGATIVE
Protein,UA: NEGATIVE
Specific Gravity, UA: 1.02 (ref 1.005–1.030)
Urobilinogen, Ur: 0.2 mg/dL (ref 0.2–1.0)
pH, UA: 5.5 (ref 5.0–7.5)

## 2022-06-04 LAB — MICROSCOPIC EXAMINATION: Bacteria, UA: NONE SEEN

## 2022-06-04 NOTE — Progress Notes (Signed)
   06/04/22  CC:  Chief Complaint  Patient presents with   Cysto    HPI: 57 year old male who presents today for cystoscopy.  He has a personal history of microscopic hematuria as well as left hydronephrosis.  Unfortunately, he did not have a CT urogram.  He reports that he was never contacted by radiology.   Blood pressure (!) 138/96, pulse 61, height 6\' 1"  (1.854 m), weight 215 lb (97.5 kg). NED. A&Ox3.   No respiratory distress   Abd soft, NT, ND Normal phallus with bilateral descended testicles  Cystoscopy Procedure Note  Patient identification was confirmed, informed consent was obtained, and patient was prepped using Betadine solution.  Lidocaine jelly was administered per urethral meatus.     Pre-Procedure: - Inspection reveals a normal caliber ureteral meatus.  Procedure: The flexible cystoscope was introduced without difficulty - No urethral strictures/lesions are present. - Enlarged prostate  - Normal bladder neck - Bilateral ureteral orifices identified - Bladder mucosa  reveals no ulcers, tumors, or lesions - No bladder stones - No trabeculation with a single very small diverticulum on the left lateral bladder wall  Retroflexion unremarkable   Post-Procedure: - Patient tolerated the procedure well  Assessment/ Plan:  1. Hydronephrosis, unspecified hydronephrosis type Schedule CT urogram-he was assisted with this today  - Urinalysis, Complete  2. Hematuria, unspecified type Cystoscopy today is unremarkable, CT urogram pending as above  F/u CT urogram  Vanna Scotland, MD

## 2022-06-10 ENCOUNTER — Ambulatory Visit
Admission: RE | Admit: 2022-06-10 | Discharge: 2022-06-10 | Disposition: A | Discharge status: Home / Self Care | Payer: BC Managed Care – PPO | Source: Ambulatory Visit | Attending: Urology | Admitting: Urology

## 2022-06-10 DIAGNOSIS — N1330 Unspecified hydronephrosis: Secondary | ICD-10-CM | POA: Diagnosis present

## 2022-06-10 DIAGNOSIS — R319 Hematuria, unspecified: Secondary | ICD-10-CM

## 2022-06-10 MED ORDER — IOHEXOL 300 MG/ML  SOLN
100.0000 mL | Freq: Once | INTRAMUSCULAR | Status: AC | PRN
  Administered 2022-06-10: 100 mL via INTRAVENOUS

## 2022-06-25 ENCOUNTER — Ambulatory Visit: Payer: BC Managed Care – PPO | Admitting: Urology

## 2022-06-25 VITALS — BP 132/89 | HR 58 | Ht 73.0 in | Wt 218.2 lb

## 2022-06-25 DIAGNOSIS — R198 Other specified symptoms and signs involving the digestive system and abdomen: Secondary | ICD-10-CM | POA: Diagnosis not present

## 2022-06-25 DIAGNOSIS — R319 Hematuria, unspecified: Secondary | ICD-10-CM

## 2022-06-25 NOTE — Progress Notes (Signed)
Marcelle Overlie Plume,acting as a scribe for Vanna Scotland, MD.,have documented all relevant documentation on the behalf of Vanna Scotland, MD,as directed by  Vanna Scotland, MD while in the presence of Vanna Scotland, MD.  06/25/2022 3:08 PM   Kyle Bishop 1965/04/07 [id number]  Referring provider: Lauro Regulus, MD [address] [facility] [place],  [place] [address]  Chief Complaint  Patient presents with   Follow-up   Results    HPI: 57 year-old male with hematuria who underwent cystoscopy, but had not had his CT urogram.   This was completed in the interim. He had no GU pathology or hydronephrosis. This was personally reviewed. He had an abdominal ultrasound in February that indicated that he may have some mild left hydronephrosis, however, while reviewing the CT urogram, this is likely related to a left sided simple renal cyst and misinterpreted.   Today, he is asymptomatic and reports no new issues.   PMH: Past Medical History:  Diagnosis Date   History of chicken pox    History of measles    History of mumps    Hyperlipidemia      Home Medications:  Allergies as of 06/25/2022   No Known Allergies      Medication List        Accurate as of June 25, 2022  3:08 PM. If you have any questions, ask your nurse or doctor.          lovastatin 20 MG tablet Commonly known as: MEVACOR Take by mouth.   propranolol 40 MG tablet Commonly known as: INDERAL Take 1 tablet by mouth 2 (two) times daily.        Family History: Family History  Problem Relation Age of Onset   Diabetes Mother        TYPE 2   Hypertension Mother    Heart attack Father    Diabetes Sister        TYPE 2   Heart attack Brother    Arthritis Son    Crohn's disease Son     Social History:  reports that he has never smoked. He has never used smokeless tobacco. He reports that he does not drink alcohol and does not use drugs.   Physical Exam: BP 132/89    Pulse (!) 58   Ht 6\' 1"  (1.854 m)   Wt 218 lb 4 oz (99 kg)   BMI 28.79 kg/m   Constitutional:  Alert and oriented, No acute distress. HEENT: [place] AT, moist mucus membranes.  Trachea midline, no masses. Neurologic: Grossly intact, no focal deficits, moving all 4 extremities. Psychiatric: Normal mood and affect.   Pertinent Imaging:  CT HEMATURIA WORKUP  Narrative CLINICAL DATA:  Microscopic hematuria.  EXAM: CT ABDOMEN AND PELVIS WITHOUT AND WITH CONTRAST  TECHNIQUE: Multidetector CT imaging of the abdomen and pelvis was performed following the standard protocol before and following the bolus administration of intravenous contrast.  RADIATION DOSE REDUCTION: This exam was performed according to the departmental dose-optimization program which includes automated exposure control, adjustment of the mA and/or kV according to patient size and/or use of iterative reconstruction technique.  CONTRAST:  OMNIPAQUE IOHEXOL 300 MG/ML  SOLN  COMPARISON:  Ultrasound 03/17/2022  FINDINGS: Lower chest: The lung bases are clear of acute process. No pleural effusion or pulmonary lesions. The heart is normal in size. No pericardial effusion. The distal esophagus and aorta are unremarkable.  Hepatobiliary: No hepatic lesions intrahepatic biliary dilatation. Gallbladder  is normal. No common dilatation.  Pancreas: No mass, inflammation ductal dilatation.  Spleen: Normal size.  No focal lesions.  Adrenals/Urinary Tract: The adrenal glands are normal.  No renal, ureteral or bladder calculi. Both kidneys demonstrate normal enhancement/perfusion following contrast administration. There is a simple nonenhancing 5 cm cyst associated with the left kidney not requiring any further imaging evaluation follow-up.  The delayed images do not demonstrate any significant collecting system abnormalities. Both ureters are normal. No bladder lesions. Tiny bladder diverticuli involving the right  anterior dome region.  Stomach/Bowel: The stomach, duodenum, small and colon are unremarkable. No acute inflammatory process, mass lesions or obstructive findings. The terminal ileum and appendix are normal.  Vascular/Lymphatic: Scattered atherosclerotic calcification involving the aorta and branch vessels but no aneurysm or dissection. The major venous structures are patent. No mesenteric or retroperitoneal mass or adenopathy.  Reproductive: The prostate gland and seminal vesicles unremarkable.  Other: No pelvic mass or adenopathy. No free pelvic fluid collections. No inguinal mass or adenopathy. No abdominal wall hernia or subcutaneous lesions.  Musculoskeletal: No significant bony findings.  IMPRESSION: 1. No CT findings to account for the patient's microhematuria. No renal, ureteral or bladder calculi or mass. 2. No acute abdominal/pelvic findings, mass lesions or adenopathy. 3. Aortic atherosclerosis.  Aortic Atherosclerosis (ICD10-I70.0).   Electronically Signed By: Rudie Meyer M.D. On: 06/12/2022 13:59  This was personally reviewed and I agree with the radiologic interpretation.    Assessment & Plan:    1. Hematuria: - Likely related to the left-sided simple renal cyst. No evidence of hydronephrosis on CT scan. - No further intervention needed at this time. Follow-up as needed -cysto also negative -consider reevaluation in 2-3 years with develops microscopic hematuria again or sooner if he develops of an episode of gross hematuria  2. Gastrointestinal Symptoms (Burping and Gas) - Recommend starting a probiotic or consuming yogurt daily to help balance gut bacteria. - Follow up with primary care physician/ GI for further evaluation if symptoms persist.  I have reviewed the above documentation for accuracy and completeness, and I agree with the above.   Vanna Scotland, MD    Return if symptoms worsen or fail to improve.  [facility] [address] [place], [place] [address] [phone]

## 2023-02-12 ENCOUNTER — Other Ambulatory Visit: Payer: Self-pay | Admitting: Internal Medicine

## 2023-02-12 DIAGNOSIS — R1032 Left lower quadrant pain: Secondary | ICD-10-CM

## 2023-02-13 ENCOUNTER — Ambulatory Visit
Admission: RE | Admit: 2023-02-13 | Discharge: 2023-02-13 | Disposition: A | Discharge status: Home / Self Care | Payer: BC Managed Care – PPO | Source: Ambulatory Visit | Attending: Internal Medicine | Admitting: Internal Medicine

## 2023-02-13 DIAGNOSIS — R1032 Left lower quadrant pain: Secondary | ICD-10-CM | POA: Insufficient documentation

## 2023-06-05 ENCOUNTER — Ambulatory Visit

## 2023-06-05 DIAGNOSIS — D175 Benign lipomatous neoplasm of intra-abdominal organs: Secondary | ICD-10-CM | POA: Diagnosis present

## 2023-06-05 DIAGNOSIS — K640 First degree hemorrhoids: Secondary | ICD-10-CM | POA: Diagnosis not present

## 2023-06-05 DIAGNOSIS — K5730 Diverticulosis of large intestine without perforation or abscess without bleeding: Secondary | ICD-10-CM | POA: Diagnosis not present
